# Patient Record
Sex: Male | Born: 1935 | Race: White | Hispanic: No | Marital: Married | Smoking: Never smoker
Health system: Southern US, Community
[De-identification: ages and names within clinical notes are randomized; demographics above are authoritative.]

## PROBLEM LIST (undated history)

## (undated) DIAGNOSIS — F0390 Unspecified dementia without behavioral disturbance: Secondary | ICD-10-CM

## (undated) DIAGNOSIS — I495 Sick sinus syndrome: Secondary | ICD-10-CM

## (undated) DIAGNOSIS — I2510 Atherosclerotic heart disease of native coronary artery without angina pectoris: Secondary | ICD-10-CM

## (undated) DIAGNOSIS — F319 Bipolar disorder, unspecified: Secondary | ICD-10-CM

## (undated) DIAGNOSIS — I714 Abdominal aortic aneurysm, without rupture, unspecified: Secondary | ICD-10-CM

## (undated) DIAGNOSIS — I82409 Acute embolism and thrombosis of unspecified deep veins of unspecified lower extremity: Secondary | ICD-10-CM

## (undated) DIAGNOSIS — R0902 Hypoxemia: Secondary | ICD-10-CM

## (undated) DIAGNOSIS — G479 Sleep disorder, unspecified: Secondary | ICD-10-CM

## (undated) DIAGNOSIS — I4891 Unspecified atrial fibrillation: Secondary | ICD-10-CM

## (undated) DIAGNOSIS — I10 Essential (primary) hypertension: Secondary | ICD-10-CM

## (undated) DIAGNOSIS — C61 Malignant neoplasm of prostate: Secondary | ICD-10-CM

## (undated) HISTORY — PX: CATARACT EXTRACTION W/ INTRAOCULAR LENS  IMPLANT, BILATERAL: SHX1307

## (undated) HISTORY — DX: Bipolar disorder, unspecified: F31.9

## (undated) HISTORY — DX: Hypoxemia: R09.02

## (undated) HISTORY — DX: Acute embolism and thrombosis of unspecified deep veins of unspecified lower extremity: I82.409

## (undated) HISTORY — DX: Atherosclerotic heart disease of native coronary artery without angina pectoris: I25.10

## (undated) HISTORY — PX: NASAL SINUS SURGERY: SHX719

## (undated) HISTORY — DX: Unspecified atrial fibrillation: I48.91

## (undated) HISTORY — DX: Malignant neoplasm of prostate: C61

## (undated) HISTORY — DX: Abdominal aortic aneurysm, without rupture: I71.4

## (undated) HISTORY — DX: Unspecified dementia, unspecified severity, without behavioral disturbance, psychotic disturbance, mood disturbance, and anxiety: F03.90

## (undated) HISTORY — DX: Sick sinus syndrome: I49.5

## (undated) HISTORY — DX: Sleep disorder, unspecified: G47.9

## (undated) HISTORY — DX: Essential (primary) hypertension: I10

## (undated) HISTORY — DX: Abdominal aortic aneurysm, without rupture, unspecified: I71.40

---

## 1999-03-14 ENCOUNTER — Other Ambulatory Visit: Admission: RE | Admit: 1999-03-14 | Discharge: 1999-03-14 | Payer: Self-pay | Admitting: Urology

## 1999-04-03 ENCOUNTER — Ambulatory Visit (HOSPITAL_COMMUNITY): Admission: RE | Admit: 1999-04-03 | Discharge: 1999-04-03 | Payer: Self-pay | Admitting: Urology

## 1999-04-03 ENCOUNTER — Encounter: Payer: Self-pay | Admitting: Urology

## 1999-04-07 ENCOUNTER — Encounter: Admission: RE | Admit: 1999-04-07 | Discharge: 1999-07-06 | Payer: Self-pay | Admitting: Radiation Oncology

## 1999-07-17 ENCOUNTER — Emergency Department (HOSPITAL_COMMUNITY): Admission: EM | Admit: 1999-07-17 | Discharge: 1999-07-17 | Payer: Self-pay | Admitting: Emergency Medicine

## 1999-07-20 ENCOUNTER — Emergency Department (HOSPITAL_COMMUNITY): Admission: EM | Admit: 1999-07-20 | Discharge: 1999-07-20 | Payer: Self-pay | Admitting: Emergency Medicine

## 1999-07-24 ENCOUNTER — Encounter (HOSPITAL_COMMUNITY): Admission: RE | Admit: 1999-07-24 | Discharge: 1999-10-22 | Payer: Self-pay | Admitting: Emergency Medicine

## 2003-04-17 ENCOUNTER — Ambulatory Visit (HOSPITAL_COMMUNITY): Admission: RE | Admit: 2003-04-17 | Discharge: 2003-04-17 | Payer: Self-pay | Admitting: Internal Medicine

## 2003-12-10 ENCOUNTER — Ambulatory Visit: Payer: Self-pay | Admitting: Licensed Clinical Social Worker

## 2003-12-17 ENCOUNTER — Ambulatory Visit: Payer: Self-pay | Admitting: Licensed Clinical Social Worker

## 2003-12-24 ENCOUNTER — Ambulatory Visit: Payer: Self-pay | Admitting: Licensed Clinical Social Worker

## 2004-01-08 ENCOUNTER — Ambulatory Visit: Payer: Self-pay | Admitting: Licensed Clinical Social Worker

## 2004-05-08 ENCOUNTER — Encounter: Admission: RE | Admit: 2004-05-08 | Discharge: 2004-06-11 | Payer: Self-pay | Admitting: Neurology

## 2005-03-02 ENCOUNTER — Emergency Department (HOSPITAL_COMMUNITY): Admission: EM | Admit: 2005-03-02 | Discharge: 2005-03-02 | Payer: Self-pay | Admitting: Emergency Medicine

## 2005-03-02 ENCOUNTER — Encounter: Admission: RE | Admit: 2005-03-02 | Discharge: 2005-03-30 | Payer: Self-pay | Admitting: Neurology

## 2005-03-31 ENCOUNTER — Encounter: Admission: RE | Admit: 2005-03-31 | Discharge: 2005-05-04 | Payer: Self-pay | Admitting: Neurology

## 2005-05-05 ENCOUNTER — Encounter: Admission: RE | Admit: 2005-05-05 | Discharge: 2005-08-03 | Payer: Self-pay | Admitting: Neurology

## 2005-06-30 ENCOUNTER — Inpatient Hospital Stay (HOSPITAL_COMMUNITY): Admission: EM | Admit: 2005-06-30 | Discharge: 2005-07-05 | Payer: Self-pay | Admitting: Emergency Medicine

## 2005-07-01 ENCOUNTER — Encounter: Payer: Self-pay | Admitting: Vascular Surgery

## 2005-07-01 ENCOUNTER — Encounter: Payer: Self-pay | Admitting: Cardiology

## 2006-06-12 ENCOUNTER — Encounter: Admission: RE | Admit: 2006-06-12 | Discharge: 2006-06-12 | Payer: Self-pay | Admitting: Gastroenterology

## 2006-06-16 ENCOUNTER — Ambulatory Visit: Payer: Self-pay | Admitting: Vascular Surgery

## 2006-10-18 ENCOUNTER — Encounter: Admission: RE | Admit: 2006-10-18 | Discharge: 2006-10-18 | Payer: Self-pay | Admitting: Gastroenterology

## 2006-10-26 ENCOUNTER — Encounter: Admission: RE | Admit: 2006-10-26 | Discharge: 2006-10-26 | Payer: Self-pay | Admitting: Gastroenterology

## 2006-11-05 ENCOUNTER — Encounter: Admission: RE | Admit: 2006-11-05 | Discharge: 2006-11-05 | Payer: Self-pay | Admitting: Surgery

## 2006-11-22 ENCOUNTER — Other Ambulatory Visit: Admission: RE | Admit: 2006-11-22 | Discharge: 2006-11-22 | Payer: Self-pay | Admitting: Otolaryngology

## 2007-01-07 ENCOUNTER — Encounter: Admission: RE | Admit: 2007-01-07 | Discharge: 2007-01-26 | Payer: Self-pay | Admitting: Neurology

## 2007-07-13 ENCOUNTER — Encounter: Admission: RE | Admit: 2007-07-13 | Discharge: 2007-07-13 | Payer: Self-pay | Admitting: Gastroenterology

## 2007-07-18 ENCOUNTER — Emergency Department (HOSPITAL_COMMUNITY): Admission: EM | Admit: 2007-07-18 | Discharge: 2007-07-19 | Payer: Self-pay | Admitting: Emergency Medicine

## 2007-07-18 ENCOUNTER — Ambulatory Visit (HOSPITAL_COMMUNITY): Admission: RE | Admit: 2007-07-18 | Discharge: 2007-07-18 | Payer: Self-pay | Admitting: Gastroenterology

## 2007-08-02 ENCOUNTER — Ambulatory Visit: Payer: Self-pay | Admitting: Vascular Surgery

## 2009-01-11 ENCOUNTER — Observation Stay (HOSPITAL_COMMUNITY): Admission: EM | Admit: 2009-01-11 | Discharge: 2009-01-12 | Payer: Self-pay | Admitting: Emergency Medicine

## 2009-01-22 ENCOUNTER — Encounter: Admission: RE | Admit: 2009-01-22 | Discharge: 2009-02-13 | Payer: Self-pay | Admitting: Internal Medicine

## 2009-02-13 ENCOUNTER — Encounter: Admission: RE | Admit: 2009-02-13 | Discharge: 2009-04-29 | Payer: Self-pay | Admitting: Internal Medicine

## 2009-02-28 ENCOUNTER — Ambulatory Visit: Payer: Self-pay | Admitting: Vascular Surgery

## 2009-09-02 ENCOUNTER — Ambulatory Visit: Payer: Self-pay | Admitting: Cardiology

## 2009-09-25 ENCOUNTER — Ambulatory Visit: Payer: Self-pay | Admitting: Cardiology

## 2010-01-02 ENCOUNTER — Ambulatory Visit: Payer: Self-pay | Admitting: Cardiology

## 2010-01-04 ENCOUNTER — Encounter: Payer: Self-pay | Admitting: Internal Medicine

## 2010-03-04 NOTE — Miscellaneous (Signed)
Summary: Device preload  Clinical Lists Changes  Observations: Added new observation of PPM INDICATN: A-fib (01/04/2010 12:32) Added new observation of MAGNET RTE: BOL 85 ERI 65 (01/04/2010 12:32) Added new observation of PPMLEADSTAT2: active (01/04/2010 12:32) Added new observation of PPMLEADSER2: [id number] (01/04/2010 12:32) Added new observation of PPMLEADMOD2: 5076  (01/04/2010 12:32) Added new observation of PPMLEADDOI2: 07/02/2005  (01/04/2010 12:32) Added new observation of PPMLEADLOC2: RV  (01/04/2010 12:32) Added new observation of PPMLEADSTAT1: active  (01/04/2010 12:32) Added new observation of PPMLEADSER1: [id number]  (01/04/2010 12:32) Added new observation of PPMLEADMOD1: 5076  (01/04/2010 12:32) Added new observation of PPMLEADDOI1: 07/02/2005  (01/04/2010 12:32) Added new observation of PPMLEADLOC1: RA  (01/04/2010 12:32) Added new observation of PPM DOI: 07/02/2005  (01/04/2010 12:32) Added new observation of PPM SERL#: [id number]  (01/04/2010 12:32) Added new observation of PPM MODL#: [id number]  (01/04/2010 12:32) Added new observation of PACEMAKERMFG: Medtronic  (01/04/2010 12:32) Added new observation of PPM IMP MD: Charlynn Court  (01/04/2010 12:32) Added new observation of PPM REFER MD: Rolla Plate  (01/04/2010 12:32) Added new observation of PACEMAKER MD: Hillis Range, MD  (01/04/2010 12:32)      PPM Specifications Following MD:  Hillis Range, MD     Referring MD:  Rolla Plate PPM Vendor:  Medtronic     PPM Model Number:  [id number]     PPM Serial Number:  [id number] PPM DOI:  07/02/2005     PPM Implanting MD:  Charlynn Court  Lead 1    Location: RA     DOI: 07/02/2005     Model #: [id number]     Serial #: [id number]     Status: active Lead 2    Location: RV     DOI: 07/02/2005     Model #: 4034     Serial #: [id number]     Status: active  Magnet Response Rate:  BOL 85 ERI 65  Indications:  A-fib

## 2010-03-18 ENCOUNTER — Encounter: Payer: Self-pay | Admitting: Internal Medicine

## 2010-03-18 ENCOUNTER — Telehealth: Payer: Self-pay | Admitting: Internal Medicine

## 2010-03-18 ENCOUNTER — Encounter (INDEPENDENT_AMBULATORY_CARE_PROVIDER_SITE_OTHER): Payer: Medicare Other

## 2010-03-18 DIAGNOSIS — I495 Sick sinus syndrome: Secondary | ICD-10-CM

## 2010-03-26 DIAGNOSIS — Z8546 Personal history of malignant neoplasm of prostate: Secondary | ICD-10-CM | POA: Insufficient documentation

## 2010-03-26 DIAGNOSIS — E7800 Pure hypercholesterolemia, unspecified: Secondary | ICD-10-CM | POA: Insufficient documentation

## 2010-03-26 DIAGNOSIS — I270 Primary pulmonary hypertension: Secondary | ICD-10-CM | POA: Insufficient documentation

## 2010-03-26 DIAGNOSIS — G20A1 Parkinson's disease without dyskinesia, without mention of fluctuations: Secondary | ICD-10-CM | POA: Insufficient documentation

## 2010-03-26 DIAGNOSIS — I4891 Unspecified atrial fibrillation: Secondary | ICD-10-CM | POA: Insufficient documentation

## 2010-03-26 DIAGNOSIS — G20 Parkinson's disease: Secondary | ICD-10-CM | POA: Insufficient documentation

## 2010-03-26 DIAGNOSIS — I359 Nonrheumatic aortic valve disorder, unspecified: Secondary | ICD-10-CM | POA: Insufficient documentation

## 2010-03-26 DIAGNOSIS — G4730 Sleep apnea, unspecified: Secondary | ICD-10-CM | POA: Insufficient documentation

## 2010-03-26 DIAGNOSIS — F319 Bipolar disorder, unspecified: Secondary | ICD-10-CM | POA: Insufficient documentation

## 2010-03-26 NOTE — Progress Notes (Signed)
Summary: trouble with device  Phone Note Call from Patient Call back at Home Phone [phone]   Caller: Spouse/ nancy Reason for Call: Talk to Nurse Summary of Call: pt wife states pt is having trouble with his device. pt states his heart is skipping, pt wife states she trouble transmitting as well.  Initial call taken by: Roe Coombs,  March 18, 2010 9:39 AM  Follow-up for Phone Call        device has reached ERI.  spoke w/pt's wife and pt to be scheduled to have ov.  will call back w/appt date and time. Vella Kohler  March 18, 2010 10:38 AM  spoke w/pt's wife--scheduled for 03-27-10 @ 1210.  Melissa to schedule Theadora Rama  March 18, 2010 4:56 PM     Appended Document: trouble with device reviewed.  I will see pt as scheduled.

## 2010-03-27 ENCOUNTER — Encounter: Payer: Self-pay | Admitting: Internal Medicine

## 2010-03-27 ENCOUNTER — Encounter (INDEPENDENT_AMBULATORY_CARE_PROVIDER_SITE_OTHER): Payer: Medicare Other | Admitting: Internal Medicine

## 2010-03-27 DIAGNOSIS — I495 Sick sinus syndrome: Secondary | ICD-10-CM

## 2010-03-27 DIAGNOSIS — R001 Bradycardia, unspecified: Secondary | ICD-10-CM | POA: Insufficient documentation

## 2010-03-27 DIAGNOSIS — I10 Essential (primary) hypertension: Secondary | ICD-10-CM | POA: Insufficient documentation

## 2010-04-01 NOTE — Letter (Signed)
Summary: Implantable Device Instructions  Architectural technologist, Main Office  [address]   [place], [place] [address]   Phone: [phone]  Fax: [phone]      Implantable Device Instructions  You are scheduled for:  ___x__ Generator Change  on April 08, 2010 with Dr. Johney Frame  1.  Please arrive at the Short Stay Center at [facility] at 10:30am on the day of your procedure.  2.  Do not eat or drink after midnight the night before your procedure.  3.  Complete lab work on 04/01/10.  The lab at [facility] is open from 8:30 AM to 1:30 PM and from 2:30 PM to 5:00 PM.  The lab at [facility] is open from 7:30 AM to 5:30 PM.  You do not have to be fasting.  4.  You may take all of your medications.  5.  Plan for an overnight stay.  Bring your insurance cards and a list of your medications.  6.  Wash your chest and neck with antibacterial soap (any brand) the evening before and the morning of your procedure.  Rinse well.   *If you have ANY questions after you get home, please call the office [phone].  Dennis Bast RN  *Every attempt is made to prevent procedures from being rescheduled.  Due to the nauture of Electrophysiology, rescheduling can happen.  The physician is always aware and directs the staff when this occurs.

## 2010-04-01 NOTE — Assessment & Plan Note (Signed)
Summary: pacer @ [facility] per kristen/mt   Visit Type:  Initial Consult Referring Provider:  Dr Patty Sermons Primary Provider:  Dr Clelia Croft  CC:  heart skipping.  History of Present Illness: Jacob Boyd is a pleasant 77 yo WM with a h/o atrial fibrillation with tachy/brady syndrome s/p PPM by Dr Reyes Ivan 07/02/05 (MDT) who presents today to establish care in the pacemaker clinic.  He reports doing very well s/p PPM implantation.  Over the past few weeks, he has had increasing irregularity of his heart beat.  He therefore presents today for evaluation.  Upon pacemaker interrogation, he is found to have reached ERI battery status. The patient has Parkinsons disease with mild congnative impairment.  His wife feels that he is doing reasonably well.  He remains somewhat active despite his age.  He denies CP, SOB, orthopnea, PND, dizziness, presyncope, or syncope.  He is otherwise without complaint today.  Current Medications (verified): 1)  Carbidopa-Levodopa 25-250 Mg Tabs (Carbidopa-Levodopa) .... Up To Four Times Daily 2)  Neurontin 300 Mg Caps (Gabapentin) .... Four Times Daily 3)  Lithium Carbonate 300 Mg Tabs (Lithium Carbonate) .... Once Daily 4)  Lopressor 50 Mg Tabs (Metoprolol Tartrate) .... 1/2 Tab Two Times A Day 5)  Vytorin 10-20 Mg Tabs (Ezetimibe-Simvastatin) .... Take One Tablet By Mouth Daily At Bedtime 6)  Trileptal 300 Mg Tabs (Oxcarbazepine) .... Four Daily 7)  Aspirin Ec 325 Mg Tbec (Aspirin) .... Take One Tablet At Bedtime 8)  Hydroxyzine Pamoate 25 Mg Caps (Hydroxyzine Pamoate) .Marland Kitchen.. 1-2 At Bedtime 9)  Exelon 9.5 Mg/24hr Pt24 (Rivastigmine) .... Use As Directed 10)  Vitamin D (Ergocalciferol) 50000 Unit Caps (Ergocalciferol) .Marland Kitchen.. 1 - Every Tuesday  Allergies (verified): 1)  ! Pcn  Past History:  Past Medical History: Tachy/brady syndrome s/p PPM (07/02/05) by Dr Reyes Ivan Atrial fibrillation AAA being managed conservatively per pt HTN Bipolar disorder Depression HTN HL Parkinsons  Disease  Mild dementia Prostate Ca s/p XRT  Past Surgical History: s/p PPM 2007 (MDT) by Dr Reyes Ivan  Family History: CAD  Social History:  He is married with 3 children.  He has never been a  smoker.  He is a retired Art therapist here in town.  His wife is a  Ph.D. psychologist here in town and is my patient.  They live in [place].  Rare ETOH.  Review of Systems       All systems are reviewed and negative except as listed in the HPI.   Vital Signs:  Patient profile:   77 year old male Height:      70 inches Weight:      183.50 pounds BMI:     26.42 Pulse rate:   104 / minute BP sitting:   129 / 89  (left arm) Cuff size:   regular  Vitals Entered By: Caralee Ates CMA (March 27, 2010 12:19 PM)  Physical Exam  General:  elderly male, NAD Head:  normocephalic and atraumatic Eyes:  PERRLA/EOM intact; conjunctiva and lids normal. Mouth:  Teeth, gums and palate normal. Oral mucosa normal. Neck:  supple Chest Wall:  L sided PPM site is well healed Lungs:  Clear bilaterally to auscultation and percussion. Heart:  RRR (paced), no m/r/g Abdomen:  Bowel sounds positive; abdomen soft and non-tender without masses, organomegaly, or hernias noted. No hepatosplenomegaly. Msk:  Back normal, normal gait. Muscle strength and tone normal. Extremities:  No clubbing or cyanosis. Neurologic:  Alert and oriented x 3. Skin:  Intact without lesions or rashes. Psych:  Normal affect.   PPM Specifications Following MD:  Hillis Range, MD     Referring MD:  Rolla Plate PPM Vendor:  Medtronic     PPM Model Number:  [id number]     PPM Serial Number:  [id number] PPM DOI:  07/02/2005     PPM Implanting MD:  Charlynn Court  Lead 1    Location: RA     DOI: 07/02/2005     Model #: 5076     Serial #: [id number]     Status: active Lead 2    Location: RV     DOI: 07/02/2005     Model #: 8295     Serial #: [id number]     Status: active  Magnet Response Rate:  BOL 85 ERI  65  Indications:  A-fib  MD Comments:  see scanned report  Impression & Recommendations:  Problem # 1:  BRADYCARDIA-TACHYCARDIA SYNDROME (ICD-427.81) The patient presents today to establish care in the pacemaker clinic.  He has reached ERI battery status and converted to VVI pacing.  WE have reprogrammed his device today to promote AV synchrony while in sinus, though he presents today in an atypical flutter.   Risks, benefits, alternatives to pacemaker pulse generator replacement were discussed in detail with the patient today.  He understands that the risks include but are not limited to bleeding, infection, MI, stroke, and death.  He accepts these risks and wishes to proceed.  We will therefore plan generator change at the next available time.  Problem # 2:  ATRIAL FIBRILLATION (ICD-427.31) stable He should ideally be on coumadin for stroke prevention.  I will defer this decision to Dr Patty Sermons who knows the patient well. no changes today  Problem # 3:  ESSENTIAL HYPERTENSION, BENIGN (ICD-401.1) stable no changes

## 2010-04-08 ENCOUNTER — Ambulatory Visit (HOSPITAL_COMMUNITY): Payer: Medicare Other

## 2010-04-08 ENCOUNTER — Ambulatory Visit (HOSPITAL_COMMUNITY)
Admission: RE | Admit: 2010-04-08 | Discharge: 2010-04-08 | Disposition: A | Discharge status: Home / Self Care | Payer: Medicare Other | Source: Ambulatory Visit | Attending: Internal Medicine | Admitting: Internal Medicine

## 2010-04-08 DIAGNOSIS — Z45018 Encounter for adjustment and management of other part of cardiac pacemaker: Secondary | ICD-10-CM | POA: Insufficient documentation

## 2010-04-08 DIAGNOSIS — I495 Sick sinus syndrome: Secondary | ICD-10-CM

## 2010-04-08 DIAGNOSIS — Z01818 Encounter for other preprocedural examination: Secondary | ICD-10-CM | POA: Insufficient documentation

## 2010-04-08 DIAGNOSIS — Z01812 Encounter for preprocedural laboratory examination: Secondary | ICD-10-CM | POA: Insufficient documentation

## 2010-04-08 DIAGNOSIS — I4892 Unspecified atrial flutter: Secondary | ICD-10-CM | POA: Insufficient documentation

## 2010-04-08 DIAGNOSIS — I4891 Unspecified atrial fibrillation: Secondary | ICD-10-CM

## 2010-04-08 DIAGNOSIS — Z01810 Encounter for preprocedural cardiovascular examination: Secondary | ICD-10-CM | POA: Insufficient documentation

## 2010-04-08 HISTORY — PX: PACEMAKER INSERTION: SHX728

## 2010-04-08 LAB — BASIC METABOLIC PANEL
BUN: 30 mg/dL — ABNORMAL HIGH (ref 6–23)
BUN: 29 mg/dL — ABNORMAL HIGH (ref 6–23)
Calcium: 9 mg/dL (ref 8.4–10.5)
Chloride: 108 mEq/L (ref 96–112)
Creatinine, Ser: 1.56 mg/dL — ABNORMAL HIGH (ref 0.4–1.5)
GFR calc Af Amer: 52 mL/min — ABNORMAL LOW (ref >60)
GFR calc Af Amer: 54 mL/min — ABNORMAL LOW (ref >60)
GFR calc non Af Amer: 45 mL/min — ABNORMAL LOW (ref >60)
Glucose, Bld: 101 mg/dL — ABNORMAL HIGH (ref 70–99)
Potassium: 5.2 mEq/L — ABNORMAL HIGH (ref 3.5–5.1)
Sodium: 139 mEq/L (ref 135–145)

## 2010-04-08 LAB — CBC
Hemoglobin: 14 g/dL (ref 13.0–17.0)
MCH: 29 pg (ref 26.0–34.0)
Platelets: 173 10*3/uL (ref 150–400)
RDW: 12.9 % (ref 11.5–15.5)
WBC: 8.1 10*3/uL (ref 4.0–10.5)

## 2010-04-08 LAB — SURGICAL PCR SCREEN: MRSA, PCR: NEGATIVE

## 2010-04-08 LAB — PROTIME-INR
INR: 1.1 (ref 0.00–1.49)
Prothrombin Time: 14.4 seconds (ref 11.6–15.2)

## 2010-04-10 ENCOUNTER — Encounter: Payer: Medicare Other | Admitting: Internal Medicine

## 2010-04-10 NOTE — Cardiovascular Report (Signed)
Summary: Office Visit   Office Visit   Imported By: Roderic Ovens 04/04/2010 12:32:49  _____________________________________________________________________  External Attachment:    Type:   Image     Comment:   External Document

## 2010-04-10 NOTE — Cardiovascular Report (Signed)
Summary: Office Visit   Office Visit   Imported By: Roderic Ovens 04/04/2010 12:33:24  _____________________________________________________________________  External Attachment:    Type:   Image     Comment:   External Document

## 2010-04-15 ENCOUNTER — Telehealth: Payer: Self-pay | Admitting: Internal Medicine

## 2010-04-16 ENCOUNTER — Encounter: Payer: Self-pay | Admitting: Internal Medicine

## 2010-04-18 NOTE — Op Note (Signed)
NAME:  Jacob Boyd, Jacob Boyd NO.:  [id number]  MEDICAL RECORD NO.:  [id number]           PATIENT TYPE:  O  LOCATION:  [facility]                         FACILITY:  [facility]  PHYSICIAN:  Hillis Range, MD       DATE OF BIRTH:  1928-01-27  DATE OF PROCEDURE: DATE OF DISCHARGE:  04/08/2010                              OPERATIVE REPORT   SURGEON:  Hillis Range, MD  PREPROCEDURE DIAGNOSES: 1. Tachycardia-bradycardia syndrome. 2. Atypical atrial flutter and atrial fibrillation. 3. Pacemaker at elective replacement indicator.  POSTPROCEDURE DIAGNOSES: 1. Tachycardia-bradycardia syndrome. 2. Atypical atrial flutter and atrial fibrillation. 3. Pacemaker at elective replacement indicator.  PROCEDURE:  Pacemaker pulse generator replacement with skin pocket revision.  INTRODUCTION:  [name] is a pleasant 77 year old gentleman with a history of atrial fibrillation, atypical atrial flutter, and tachycardia/bradycardia syndrome, status post pacemaker implantation by Dr. Reyes Ivan on May 02, 2005.  Unfortunately, he has reached elective replacement indicator.  He therefore presents today for pacemaker pulse generator replacement.  DESCRIPTION OF PROCEDURE:  Informed written consent was obtained and the patient was brought to the Electrophysiology Lab in the fasting state. His pacemaker was interrogated and confirmed to be at [facility] have battery status.  No sedation was required for the procedure today.  The patient was prepped and draped in the usual sterile fashion by the EP Lab staff. The skin overlying his existing pacemaker was infiltrated with lidocaine for local analgesia.  A 4-cm incision was made over the pacemaker pocket.  The pacemaker was exposed using a combination of sharp and blunt dissection.  A single #2 silk suture was identified and removed, which was securing the device to the pectoralis fascia.  The pacemaker was then removed from the body and disconnected from the  leads.  The atrial lead was confirmed to be a Medtronic model [id number] - 52 (serial number [id number]) lead implanted on Jul 02, 2005.  The right ventricular lead was confirmed to be a Medtronic model 5076 - 58 (serial number [id number]) lead implanted on Jul 02, 2005.  Both leads were examined and their integrity was found to be intact.  Atrial flutter waves measured 3.6 millivolts with an impedance of 486 ohms.  Right ventricular lead R-waves measured 7.1 millivolts with an impedance of 401 ohms and a threshold of 0.5 volts at 0.5 milliseconds.  Both leads were connected to a Medtronic Adapta L model ADDR01 (serial number [id number]) pacemaker.  The pacemaker pocket was then revised to accommodate the new device.  The pocket was then irrigated with copious gentamicin solution.  The pacemaker was then placed into the pocket. The pocket was then closed in two layers with 2-0 Vicryl suture for the subcutaneous and subcuticular layers.  Steri-Strips and a sterile dressing were then applied.  There were no early apparent complications.  CONCLUSIONS: 1. Successful pacemaker pulse generator replacement with a Medtronic     Adapta L dual-chamber pacemaker. 2. No early apparent complications.     Hillis Range, MD     JA/MEDQ  D:  04/08/2010  T:  04/09/2010  Job:  [id number]  cc:   Cassell Clement,  M.D. Kari Baars, M.D.  Electronically Signed by Hillis Range MD on 04/18/2010 10:56:32 PM

## 2010-04-21 ENCOUNTER — Ambulatory Visit (INDEPENDENT_AMBULATORY_CARE_PROVIDER_SITE_OTHER): Payer: Medicare Other

## 2010-04-21 ENCOUNTER — Encounter: Payer: Self-pay | Admitting: Internal Medicine

## 2010-04-21 ENCOUNTER — Encounter (INDEPENDENT_AMBULATORY_CARE_PROVIDER_SITE_OTHER): Payer: Medicare Other

## 2010-04-21 DIAGNOSIS — I4891 Unspecified atrial fibrillation: Secondary | ICD-10-CM

## 2010-04-21 DIAGNOSIS — Z7901 Long term (current) use of anticoagulants: Secondary | ICD-10-CM

## 2010-04-21 DIAGNOSIS — I495 Sick sinus syndrome: Secondary | ICD-10-CM

## 2010-04-22 NOTE — Miscellaneous (Signed)
Summary: Device change out  Clinical Lists Changes  Observations: Added new observation of PPM IMP MD: Hillis Range, MD (04/16/2010 9:45) Added new observation of PPM DOI: 04/08/2010 (04/16/2010 9:45) Added new observation of PPM SERL#: [id number] (04/16/2010 9:45) Added new observation of PPM MODL#: ADDRL1 (04/16/2010 9:45) Added new observation of PPMEXPLCOMM: 04/08/10 Medtronic EnRhythm [id number] H explanted (04/16/2010 9:45)      PPM Specifications Following MD:  Hillis Range, MD     Referring MD:  Rolla Plate PPM Vendor:  Medtronic     PPM Model Number:  ADDRL1     PPM Serial Number:  [id number] PPM DOI:  04/08/2010     PPM Implanting MD:  Hillis Range, MD  Lead 1    Location: RA     DOI: 07/02/2005     Model #: 7829     Serial #: [id number]     Status: active Lead 2    Location: RV     DOI: 07/02/2005     Model #: 8469     Serial #: [id number]     Status: active  Magnet Response Rate:  BOL 85 ERI 65  Indications:  A-fib  Explantation Comments:  04/08/10 Medtronic EnRhythm P1501DR/[id number] H explanted

## 2010-04-22 NOTE — Progress Notes (Signed)
Summary: want to [facility] when he can shower  Phone Note Call from Patient Call back at Home Phone [phone]   Caller: Chesley Noon Summary of Call: Pt wife calling regarding her husband pacermaker want to know when the pt can take a shower Initial call taken by: Judie Grieve,  April 15, 2010 12:26 PM  Follow-up for Phone Call        okay to shower one week after the procedure Dennis Bast, RN, BSN  April 15, 2010 12:31 PM

## 2010-04-28 ENCOUNTER — Encounter (INDEPENDENT_AMBULATORY_CARE_PROVIDER_SITE_OTHER): Payer: Medicare Other | Admitting: *Deleted

## 2010-04-28 ENCOUNTER — Ambulatory Visit (INDEPENDENT_AMBULATORY_CARE_PROVIDER_SITE_OTHER): Payer: Medicare Other | Admitting: *Deleted

## 2010-04-28 DIAGNOSIS — R0989 Other specified symptoms and signs involving the circulatory and respiratory systems: Secondary | ICD-10-CM

## 2010-04-28 DIAGNOSIS — I4891 Unspecified atrial fibrillation: Secondary | ICD-10-CM

## 2010-04-28 DIAGNOSIS — Z7901 Long term (current) use of anticoagulants: Secondary | ICD-10-CM

## 2010-05-06 LAB — CBC
HCT: 38.5 % — ABNORMAL LOW (ref 39.0–52.0)
Hemoglobin: 13 g/dL (ref 13.0–17.0)
MCHC: 33.8 g/dL (ref 30.0–36.0)
MCV: 87.9 fL (ref 78.0–100.0)
RDW: 14.9 % (ref 11.5–15.5)

## 2010-05-06 LAB — CK ISOENZYMES
CK-MB: 0 % (ref <5)
CK-MM: 100 % (ref 95–100)
Creatine Kinase-Total: 104 U/L (ref 44–196)

## 2010-05-06 LAB — POCT I-STAT, CHEM 8
Creatinine, Ser: 1.4 mg/dL (ref 0.4–1.5)
HCT: 39 % (ref 39.0–52.0)
Hemoglobin: 13.3 g/dL (ref 13.0–17.0)
Potassium: 4.4 mEq/L (ref 3.5–5.1)

## 2010-05-06 LAB — DIFFERENTIAL: Lymphocytes Relative: 6 % — ABNORMAL LOW (ref 12–46)

## 2010-05-06 LAB — BASIC METABOLIC PANEL
BUN: 29 mg/dL — ABNORMAL HIGH (ref 6–23)
Calcium: 8.7 mg/dL (ref 8.4–10.5)
Creatinine, Ser: 1.29 mg/dL (ref 0.4–1.5)
GFR calc Af Amer: >60 mL/min (ref >60)
Potassium: 4.2 mEq/L (ref 3.5–5.1)
Sodium: 137 mEq/L (ref 135–145)

## 2010-05-06 NOTE — Procedures (Signed)
Summary: wound check/mt   Current Medications (verified): 1)  Carbidopa-Levodopa 25-250 Mg Tabs (Carbidopa-Levodopa) .... Up To Four Times Daily 2)  Neurontin 300 Mg Caps (Gabapentin) .... Four Times Daily 3)  Lithium Carbonate 300 Mg Tabs (Lithium Carbonate) .... Once Daily 4)  Lopressor 50 Mg Tabs (Metoprolol Tartrate) .... 1/2 Tab Two Times A Day 5)  Vytorin 10-20 Mg Tabs (Ezetimibe-Simvastatin) .... Take One Tablet By Mouth Daily At Bedtime 6)  Trileptal 300 Mg Tabs (Oxcarbazepine) .... Four Daily 7)  Aspirin Ec 325 Mg Tbec (Aspirin) .... Take One Tablet At Bedtime 8)  Hydroxyzine Pamoate 25 Mg Caps (Hydroxyzine Pamoate) .Marland Kitchen.. 1-2 At Bedtime 9)  Exelon 9.5 Mg/24hr Pt24 (Rivastigmine) .... Use As Directed 10)  Vitamin D (Ergocalciferol) 50000 Unit Caps (Ergocalciferol) .Marland Kitchen.. 1 - Every Tuesday  Allergies (verified): 1)  ! Pcn  PPM Specifications Following MD:  Hillis Range, MD     Referring MD:  Rolla Plate PPM Vendor:  Medtronic     PPM Model Number:  ADDRL1     PPM Serial Number:  [id number] [facility] PPM DOI:  04/08/2010     PPM Implanting MD:  Hillis Range, MD  Lead 1    Location: RA     DOI: 07/02/2005     Model #: 8469     Serial #: [id number]     Status: active Lead 2    Location: RV     DOI: 07/02/2005     Model #: 4401     Serial #: [id number]     Status: active  Magnet Response Rate:  BOL 85 ERI 65  Indications:  A-fib  Explantation Comments:  04/08/10 Medtronic EnRhythm [id number] H explanted Tech Comments:  meds reviewed. see paceart report. Vella Kohler  April 21, 2010 3:35 PM   Patient Instructions: 1)  Your physician recommends that you schedule a follow-up appointment in: 07-10-10 @ 1045 with Dr Johney Frame.

## 2010-05-14 ENCOUNTER — Ambulatory Visit (INDEPENDENT_AMBULATORY_CARE_PROVIDER_SITE_OTHER): Payer: Medicare Other | Admitting: *Deleted

## 2010-05-14 DIAGNOSIS — I4891 Unspecified atrial fibrillation: Secondary | ICD-10-CM

## 2010-05-14 LAB — POCT INR: INR: 2.5

## 2010-05-16 ENCOUNTER — Other Ambulatory Visit: Payer: Self-pay | Admitting: *Deleted

## 2010-05-16 DIAGNOSIS — Z79899 Other long term (current) drug therapy: Secondary | ICD-10-CM

## 2010-05-16 DIAGNOSIS — E7800 Pure hypercholesterolemia, unspecified: Secondary | ICD-10-CM

## 2010-05-19 ENCOUNTER — Ambulatory Visit (INDEPENDENT_AMBULATORY_CARE_PROVIDER_SITE_OTHER): Payer: Medicare Other | Admitting: Cardiology

## 2010-05-19 ENCOUNTER — Encounter: Payer: Self-pay | Admitting: *Deleted

## 2010-05-19 ENCOUNTER — Encounter: Payer: Self-pay | Admitting: Cardiology

## 2010-05-19 ENCOUNTER — Other Ambulatory Visit (INDEPENDENT_AMBULATORY_CARE_PROVIDER_SITE_OTHER): Payer: Medicare Other | Admitting: *Deleted

## 2010-05-19 ENCOUNTER — Ambulatory Visit (INDEPENDENT_AMBULATORY_CARE_PROVIDER_SITE_OTHER): Payer: Medicare Other | Admitting: *Deleted

## 2010-05-19 DIAGNOSIS — I4891 Unspecified atrial fibrillation: Secondary | ICD-10-CM

## 2010-05-19 DIAGNOSIS — I495 Sick sinus syndrome: Secondary | ICD-10-CM

## 2010-05-19 DIAGNOSIS — G20 Parkinson's disease: Secondary | ICD-10-CM

## 2010-05-19 DIAGNOSIS — E7800 Pure hypercholesterolemia, unspecified: Secondary | ICD-10-CM

## 2010-05-19 DIAGNOSIS — Z79899 Other long term (current) drug therapy: Secondary | ICD-10-CM

## 2010-05-19 LAB — HEPATIC FUNCTION PANEL
ALT: 21 U/L (ref 0–53)
Bilirubin, Direct: 0.1 mg/dL (ref 0.0–0.3)
Total Bilirubin: 0.5 mg/dL (ref 0.3–1.2)
Total Protein: 6.3 g/dL (ref 6.0–8.3)

## 2010-05-19 LAB — BASIC METABOLIC PANEL
BUN: 32 mg/dL — ABNORMAL HIGH (ref 6–23)
Calcium: 9.4 mg/dL (ref 8.4–10.5)
Chloride: 106 mEq/L (ref 96–112)
Creatinine, Ser: 1.5 mg/dL (ref 0.4–1.5)

## 2010-05-19 LAB — LIPID PANEL
Cholesterol: 184 mg/dL (ref 0–200)
HDL: 60.7 mg/dL (ref >39.00)
LDL Cholesterol: 103 mg/dL — ABNORMAL HIGH (ref 0–99)
Triglycerides: 102 mg/dL (ref 0.0–149.0)

## 2010-05-19 NOTE — Assessment & Plan Note (Signed)
The patient had his pacemaker pulse generator replacement with skin pocket revision by Dr. Johney Frame on 04/08/10.  He is not having any problem from the operative site.  There is no evidence of any hemorrhage or hematoma.  The patient has not been having any dizzy spells or syncope.

## 2010-05-19 NOTE — Assessment & Plan Note (Signed)
The patient's Parkinson's disease has been stable.  The patient is not having any new Parkinson's symptoms.  He walks in his neighborhood for exercise.

## 2010-05-19 NOTE — Progress Notes (Signed)
Taeveon Keesling Ballengee Date of Birth:  July 10, 1927 [facility] [address]   [address] [place], [place]  [address] [phone]           Fax   [phone]  History of Present Illness: This pleasant 77 year old retired gentleman is seen for a scheduled followup visit.  He has a history of tachybradycardia syndrome.  His initial pacemaker was placed on 07/02/05 by Dr. Reyes Ivan.  He had a pacemaker pulse generator replacement on 04/08/10 by Dr. Johney Frame.  The patient is not having any chest pain or shortness of breath he is having the dizzy spells or syncope.  He has a past history of PVCs which have subsided.  He does not have any history of known coronary disease and had a normal adenosine Cardiolite stress test in January 2003.  He had a echocardiogram on 08/10/07 which showed moderate LVH with normal systolic function with impaired relaxation and also demonstrated moderate aortic sclerosis without aortic stenosis and without aortic insufficiency.  He had mild mitral regurgitation and normal pulmonary artery pressure.  Current Outpatient Prescriptions  Medication Sig Dispense Refill  . aspirin 325 MG tablet Take 81 mg by mouth daily.       . carbidopa-levodopa (SINEMET) 25-250 MG per tablet Take 1 tablet by mouth 4 (four) times daily.        . Cholecalciferol (VITAMIN D PO) Take by mouth.       . ezetimibe-simvastatin (VYTORIN) 10-20 MG per tablet Take 1 tablet by mouth at bedtime.        . gabapentin (NEURONTIN) 300 MG capsule Take 300 mg by mouth 4 (four) times daily.        Marland Kitchen lithium 300 MG capsule Take 300 mg by mouth 1 dose over 46 hours.        . metoprolol tartrate (LOPRESSOR) 25 MG tablet Take 25 mg by mouth 2 (two) times daily.        . multivitamin (THERAGRAN) per tablet Take 1 tablet by mouth daily.        . Oxcarbazepine (TRILEPTAL) 300 MG tablet Take 300 mg by mouth 4 (four) times daily.       . rivastigmine (EXELON) 4.6 mg/24hr Place 1 patch onto the skin daily.         Marland Kitchen warfarin (COUMADIN) 5 MG tablet Take 5 mg by mouth daily. Taking 5mg x3 days and 2.5mg x 4 days      . donepezil (ARICEPT) 10 MG tablet Take 10 mg by mouth at bedtime as needed.        . vitamin B-12 (CYANOCOBALAMIN) 100 MCG tablet Take 50 mcg by mouth daily.          Allergies  Allergen Reactions  . Penicillins     Patient Active Problem List  Diagnoses  . HYPERCHOLESTEROLEMIA  . BIPOLAR AFFECTIVE DISORDER  . PARKINSON'S DISEASE  . ESSENTIAL HYPERTENSION, BENIGN  . PRIMARY PULMONARY HYPERTENSION  . AORTIC VALVE SCLEROSIS  . Atrial fibrillation  . BRADYCARDIA-TACHYCARDIA SYNDROME  . SLEEP APNEA  . PERSONAL HISTORY MALIGNANT NEOPLASM PROSTATE    History  Smoking status  . Never Smoker   Smokeless tobacco  . Not on file    History  Alcohol Use: Not on file    No family history on file.  Review of Systems: Constitutional: no fever chills diaphoresis or fatigue or change in weight.  Head and neck: no hearing loss, no epistaxis, no photophobia or visual disturbance. Respiratory: No cough, shortness of breath  or wheezing. Cardiovascular: No chest pain peripheral edema, palpitations. Gastrointestinal: No abdominal distention, no abdominal pain, no change in bowel habits hematochezia or melena. Genitourinary: No dysuria, no frequency, no urgency, no nocturia. Musculoskeletal:No arthralgias, no back pain, no gait disturbance or myalgias. Neurological: No dizziness, no headaches, no numbness, no seizures, no syncope, no weakness, no tremors. Hematologic: No lymphadenopathy, no easy bruising. Psychiatric: No confusion, no hallucinations, no sleep disturbance.    Physical Exam: Filed Vitals:   05/19/10 1019  BP: 120/72  Pulse: 80  His weight is 182, unchanged.  The general appearance reveals a pleasant elderly gentleman in no distress.Pupils equal and reactive.   Extraocular Movements are full.  There is no scleral icterus.  The mouth and pharynx are normal.  The  neck is supple.  The carotids reveal no bruits.  The jugular venous pressure is normal.  The thyroid is not enlarged.  There is no lymphadenopathy.The chest is clear to percussion and auscultation. There are no rales or rhonchi. Expansion of the chest is symmetrical.The precordium is quiet.  The first heart sound is normal.  The second heart sound is physiologically split.  There is no murmur gallop rub or click.  There is no abnormal lift or heave.The abdomen is soft and nontender. Bowel sounds are normal. The liver and spleen are not enlarged. There Are no abdominal masses. There are no bruits.The pedal pulses are good.  There is no phlebitis or edema.  There is no cyanosis or clubbing. Neurologic exam reveals mild parkinsonism tremor.The skin is warm and dry.  There is no rash.  Assessment / Plan: Continue same medication.  Recheck in one month for protime, 2 months for a protime, and 3 months for a office visit protime and EKG

## 2010-05-19 NOTE — Assessment & Plan Note (Signed)
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  He has a history of tachybradycardia syndrome.  He recently had his pacemaker generator replaced.  He was noted that time to have atrial fibrillation and is now on Coumadin.  He's not having any problems from the Coumadin.  His INR today is therapeutic at 2.4 on the dose of 5 mg Monday Wednesday and Friday and 2.5 mg other days.  The patient has not been having any falling spells.  He is trying to get regular exercise by going to the silver sneakers program Out at [employer].

## 2010-06-03 ENCOUNTER — Encounter: Payer: Medicare Other | Admitting: *Deleted

## 2010-06-16 ENCOUNTER — Ambulatory Visit (INDEPENDENT_AMBULATORY_CARE_PROVIDER_SITE_OTHER): Payer: Medicare Other | Admitting: *Deleted

## 2010-06-16 ENCOUNTER — Encounter: Payer: Medicare Other | Admitting: *Deleted

## 2010-06-16 DIAGNOSIS — I4891 Unspecified atrial fibrillation: Secondary | ICD-10-CM

## 2010-06-17 NOTE — Assessment & Plan Note (Signed)
OFFICE VISIT   Jacob Boyd, Jacob Boyd  DOB:  1927-11-09                                       08/02/2007  EAVWU#:[id number]   I saw the patient in the office today for continued followup of his  abdominal aortic aneurysm.  When I saw him last a year ago the aneurysm  measured 3.3 cm in maximum diameter.  I recommended followup ultrasound  in 1 year.  Since I saw him last he has had no significant abdominal or  back pain.  There has been no significant change in his medical history.   PAST MEDICAL HISTORY:  Significant for hypertension and  hypercholesterolemia.   SOCIAL HISTORY:  He is not a smoker.   REVIEW OF SYSTEMS AND MEDICATIONS:  He has had no recent chest pain,  chest pressure, palpitations or arrhythmias.  He has had no bronchitis,  asthma or wheezing.  Review of systems is otherwise documented on the  medical history form in his chart as are his medications.   PHYSICAL EXAMINATION:  General:  On physical examination this is a  pleasant 77 year old gentleman who appears his stated age.  Neck:  Neck  is supple.  There is no cervical lymphadenopathy.  I do not detect any  carotid bruits.  Vital signs:  Blood pressure is 133/74, heart rate is  60.  Lungs:  Are clear bilaterally to auscultation.  Cardiac:  He has a  regular rate and rhythm.  Abdomen:  His aneurysm is easily palpable and  nontender.  Extremities:  He has palpable femoral and posterior tibial  pulses bilaterally.  He has no significant lower extremity swelling.  Neurologic:  Exam is nonfocal.  He has no evidence of atheroembolic  disease.   Ultrasound of his aneurysm shows the maximum diameter is 3.1 cm.  Thus  there has been no change in the size of his aneurysm.   Given that the aneurysm is fairly small and given his age and the fact  that the aneurysm has not changed in size I think it is reasonable to  stretch his followup out to 18 months.  I will see him back at that time  with a  followup ultrasound.  He knows to call sooner if he has problems.   Di Kindle. Edilia Bo, M.D.  Electronically Signed   CSD/MEDQ  D:  08/02/2007  T:  08/03/2007  Job:  [id number]

## 2010-06-17 NOTE — Procedures (Signed)
DUPLEX ULTRASOUND OF ABDOMINAL AORTA   INDICATION:  Followup of known AAA.   HISTORY:  Diabetes:  No.  Cardiac:  Pacemaker two years ago.  Hypertension:  Yes.  Smoking:  No.  Connective Tissue Disorder:  Family History:  No.  Previous Surgery:  Cardiac.   DUPLEX EXAM:         AP (cm)                   TRANSVERSE (cm)  Proximal             1.95 Cm                   2.08 cm  Mid                  2.65 cm                   3.06 cm  Distal               1.85 cm                   2.01 cm  Right Iliac          0.83 cm                   0.85 cm  Left Iliac           1.13 cm                   1.17 cm   PREVIOUS:  Date: 06/16/06  AP:  2.7  TRANSVERSE:  3.3   IMPRESSION:  1. Stable abdominal aortic aneurysm with maximum diameter measuring      2.65 cm (anteroposterior) X 3.06 cm.  2. Calcific plaquing noted in abdominal aortic aneurysm.  3. Ectatic left common iliac artery.   ___________________________________________  Di Kindle. Edilia Bo, M.D.   PB/MEDQ  D:  08/02/2007  T:  08/02/2007  Job:  [id number]

## 2010-06-17 NOTE — Procedures (Signed)
DUPLEX ULTRASOUND OF ABDOMINAL AORTA   INDICATION:  Followup of abdominal aortic aneurysm.   HISTORY:  Diabetes:  No.  Cardiac:  Pacemaker.  Hypertension:  Yes.  Smoking:  No.  Connective Tissue Disorder:  Family History:  No.  Previous Surgery:  No.   DUPLEX EXAM:         AP (cm)                   TRANSVERSE (cm)  Proximal             2.56 cm                   2.36 cm  Mid                  3.11 cm                   2.88 cm  Distal               1.9 cm                    2.2 cm  Right Iliac          1.12 cm                   1.51 cm  Left Iliac           0.66 cm                   1.12 cm   PREVIOUS:  Date:  08/02/2007  AP:  2.65  TRANSVERSE:  3.06   IMPRESSION:  1. Abdominal aortic aneurysm with largest measurement 3.11 x 2.88 cm.  2. Thickening of aortic walls in mid aorta.   ___________________________________________  Di Kindle. Edilia Bo, M.D.   CJ/MEDQ  D:  02/28/2009  T:  02/28/2009  Job:  [id number]

## 2010-06-17 NOTE — Assessment & Plan Note (Signed)
OFFICE VISIT   Jacob Boyd, Jacob Boyd  DOB:  [birth year]                                       02/28/2009  AVWUJ#:[id number]   I saw the patient in the office today for continued followup of a small  abdominal aortic aneurysm.  I last saw him in June of 2009 when it  measured 3.1 cm in maximum diameter.  There had been no change in the  size of the aneurysm so I recommended that we stretch his followup out  to a year and a half.  Since I saw him last he has had no significant  abdominal or back pain.  He has hypertension and hypercholesterolemia,  both of which are stable on his current medications.  He also has a  pacemaker.  He denies any history of diabetes, history of previous  myocardial infarction or history of congestive heart failure.   SOCIAL HISTORY:  He is married with three children.  He does not smoke  cigarettes.   REVIEW OF SYSTEMS:  CARDIOVASCULAR:  He has had no recent chest pain,  chest pressure, palpitations or significant orthopnea.  He does have  some dyspnea on exertion.  He has had no real claudication or rest pain.  He has had no history of stroke or TIAs.  He has had no history of DVT.  PULMONARY:  He has had no productive cough, bronchitis, asthma or  wheezing.   PHYSICAL EXAMINATION:  General:  This is a pleasant 77 year old  gentleman who appears his stated age.  Vital signs:  Blood pressure is  130/80, heart rate is 61.  Temperature is 97.4.  Lungs:  Clear  bilaterally to auscultation without rales, rhonchi or wheezing.  Cardiovascular:  I do not detect any carotid bruits.  He has a regular  rate and rhythm.  He has palpable radial pulses and palpable femoral and  posterior tibial pulses bilaterally.  He has a left dorsalis pedis with  no right dorsalis pedis pulse.  Abdomen:  Soft and nontender.  His  aneurysm is easily palpable and nontender.  He has normal pitched bowel  sounds.  Neurologic:  He has no focal weakness or  paresthesias.   I have independently interpreted his ultrasound which shows that the  maximum diameter of his aneurysm is 3.1 cm.  Thus there has been no  significant change compared to the study a year and a half ago.  For  this reason I think it is safe to continue his followup at 18 months and  will see him back in 18 months with a followup duplex scan and I have  ordered this test.  He knows to call sooner if he has problems.     Di Kindle. Edilia Bo, M.D.  Electronically Signed   CSD/MEDQ  D:  02/28/2009  T:  03/01/2009  Job:  [id number]   cc:   Kari Baars, M.D.  Cassell Clement, M.D.

## 2010-06-20 NOTE — Cardiovascular Report (Signed)
NAME:  Jacob Boyd, Jacob Boyd NO.:  [id number]   MEDICAL RECORD NO.:  [id number]          PATIENT TYPE:  INP   LOCATION:  2006                         FACILITY:  [facility]   PHYSICIAN:  Elmore Guise., M.D.DATE OF BIRTH:  [birth year]   DATE OF PROCEDURE:  07/02/2005  DATE OF DISCHARGE:                              CARDIAC CATHETERIZATION   PROCEDURE:  Dual-chamber permanent pacemaker implant.   INDICATIONS FOR PROCEDURE:  Tachybrady syndrome with significant sinus  pauses up to 6 seconds.   DESCRIPTION OF PROCEDURE:  The patient brought to the cardiac cath lab after  appropriate informed consent.  He was presently sterile fashion.  Approximately 20 mL of 1% lidocaine was used for local anesthesia.  A 1-1/2  inch incision was made in the left deltopectoral groove.  A subcutaneous  pocket was then made with blunt and Bovie dissection.  A venogram was then  performed showing the root of the left axillary/subclavian vein.  A 7-French  sheath was placed in the left subclavian vein under fluoroscopic guidance  x2.  The ventricular lead was then placed.  It is a Medtronic 5076-58 cm,  serial number [id number], to the RV septum.  The following measurements were  obtained:  Threshold of 0.7 V with a pulse width of 0.5 msec, impedance of  787 ohms.  R waves measured 6.5 mV with a current of 1.2 mA.  Ten-volt check  was negative.  The atrial lead was then placed.  The atrial lead is a  Medtronic 5076-52 cm, serial number [id number], active-fixation lead, which  was placed in the right atrial appendage.  The following measurements were  obtained:  No threshold was obtained.  The patient was in atrial flutter  with flutter the rate of 300 per minute.  Pulse of 0.5 msec, impedance of  785 Ohms with P waves measuring 4.2 mV.  Ten-volt check was in negative.  After placing the active fixation leads, the pocket was copiously irrigated  with kanamycin solution.  All bleeding had stopped  at that time.  The atrial  and ventricular lead were then identified and placed in the appropriate  headers and an InRhythm P1501DR Medtronic generator, serial number  [id number], generator was sewn into the pocket.  Pocket was then closed in 3  continuous layers with 2-0, followed by 2-0, followed by 4-0 Vicryl suture.  Steri-Strips were applied.  The patient tolerated the procedure well.  The  patient did have recurrent atrial flutter on placing the ventricular lead.  He initially was paced out, however, did go back into atrial flutter during  the procedure.  He was given 3 doses of intravenous Lopressor for rate  control with success.  The patient tolerated the procedure well.  He will be  transported to the holding area in stable condition.      Elmore Guise., M.D.  Electronically Signed     TWK/MEDQ  D:  07/02/2005  T:  07/02/2005  Job:  [id number]   cc:   Cassell Clement, M.D.  Fax: [phone]

## 2010-06-20 NOTE — Consult Note (Signed)
NAME:  Jacob Boyd, Jacob Boyd NO.:  [id number]   MEDICAL RECORD NO.:  [id number]          PATIENT TYPE:  INP   LOCATION:  2006                         FACILITY:  [facility]   PHYSICIAN:  Di Kindle. Edilia Bo, M.D.DATE OF BIRTH:  12-14-1927   DATE OF CONSULTATION:  07/01/2005  DATE OF DISCHARGE:                                   CONSULTATION   REASON FOR CONSULTATION:  Transient left leg pain.   HISTORY:  This is a pleasant 77 year old gentleman who last night had  developed fairly sudden onset of left ankle pain.  He states that this  lasted approximately 30 minutes and resolved spontaneously.  He states that  he has had no further left leg pain or paresthesias.  Prior to this, he  denied any history of claudication, although his activity I believe is  somewhat limited because of his Parkinson's disease.  He denies any history  of previous rest pain or any history of nonhealing ulcers.  The patient is  ambulatory with a cane.  He had been seen in the [facility] where it  was noted that the foot was slightly cooler on the left however, when he was  seen at [facility] this had actually resolved.   PAST MEDICAL HISTORY:  1.  Parkinson's disease.  2.  Bipolar disorder.  3.  Sleep apnea.  4.  History of prostate cancer.  5.  Hypercholesterolemia.  6.  He denies any history of diabetes, hypertension, history of previous      myocardial infarction, history of congestive heart failure, or history      of COPD.   PAST SURGICAL HISTORY:  Significant for surgery on his uvula for sleep  apnea.   SOCIAL HISTORY:  He is married and has three children.  He has never used  tobacco.   FAMILY HISTORY:  He is unaware of any history of premature cardiovascular  disease.   REVIEW OF SYSTEMS:  He has had no recent chest pain, chest pressure,  palpitations or arrhythmias that he is aware of. PULMONARY:  He has had no  bronchitis, asthma, or wheezing.  GI:  He has had no recent  change in his  bowel habits.  GU:  He has had no dysuria or frequency.  He does have a  history of prostate cancer in the past.  VASCULAR:  He has had no history  stroke, TIAs, expressive or receptive aphasia or amaurosis fugax.  He has  had no history of DVT or phlebitis that he is aware of. Review of systems is  otherwise unremarkable and is documented in his admission history and  physical.   PHYSICAL EXAMINATION:  VITAL SIGNS:  Temperature is 97.8, blood pressure  107/78, heart rate is 92.  HEENT:  There is no carotid bruits.  There is no cervical lymphadenopathy.  CARDIAC:  He is bradycardic currently and has had some pauses and is  currently being monitored.  LUNGS:  Clear bilaterally to auscultation.  ABDOMEN:  Soft and nontender.  I cannot palpate an aneurysm.  EXTREMITIES:  There is no significant lower extremity swelling.  He has some  mild eczema in both legs.  VASCULAR:  Exam reveals normal femoral and popliteal pulses bilaterally and  a palpable right posterior tibial pulse.  On the right side, he has a  monophasic peroneal and anterior tibial signal I cannot get a dorsalis pedis  signal on the right.  On the left side, he has a monophasic peroneal,  posterior tibial and dorsalis pedis signal.  EXTREMITIES:  Both feet appear adequately perfused.  The color is good in  both feet.  He has no motor or sensory deficit currently in his lower  extremities.   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 Differential diagnosis  if he did have an embolic event would be a cardiac source of embolus which  is being worked up by cardiology. Will also check an abdominal ultrasound to  rule out an aneurysm.  In addition, he has slightly prominent popliteal  pulses and will duplex his popliteal arteries to rule out popliteal  artery  aneurysms.  He is also to have ABIs today.  As he is asymptomatic and given  his age, I would not plan to be overly aggressive as he currently does have  an adequately perfused foot.  If his symptoms worsen or workup reveals  evidence he is at risk for continued embolization then consideration would  be given for arteriography and revascularization.  Will make further  recommendations pending the results of his workup.      Di Kindle. Edilia Bo, M.D.  Electronically Signed     CSD/MEDQ  D:  07/01/2005  T:  07/01/2005  Job:  [id number]   cc:   Cassell Clement, M.D.  Fax: [phone]

## 2010-06-20 NOTE — Discharge Summary (Signed)
NAME:  Jacob Boyd, Jacob Boyd NO.:  [id number]   MEDICAL RECORD NO.:  [id number]          PATIENT TYPE:  INP   LOCATION:  2006                         FACILITY:  [facility]   PHYSICIAN:  Cassell Clement, M.D. DATE OF BIRTH:  1927-11-09   DATE OF ADMISSION:  06/30/2005  DATE OF DISCHARGE:  07/05/2005                                 DISCHARGE SUMMARY   FINAL DIAGNOSES:  1.  Paroxysmal atrial fibrillation with tachybrady syndrome and 6-second      pauses.  2.  Transient left leg pain felt secondary to systemic embolus secondary to      atrial fibrillation.  3.  Parkinson's disease.  4.  Bipolar disorder.  5.  Sleep apnea.  6.  History of prostate cancer.  7.  Hypercholesterolemia.  8.  Aortic valve sclerosis.  9.  Mild pulmonary artery hypertension.  10. Dilated left atrium.   OPERATIONS PERFORMED:  1.  Duplex scanning of legs.  2.  Echocardiogram.  3.  Implantation of a dual-chamber permanent pacemaker using EnRhythm      Medtronic generator by Dr. Lady Deutscher.   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 Lady Deutscher.  He presented with left lower extremity  pain.  He had gone to an urgent care center and was found to have a cold  left lower extremity with diminished pulses and was sent to the emergency  room for further evaluation.  By the time he was seen in the emergency room  by Dr. Reyes Ivan he felt better but he was admitted for further observation.  He was in normal sinus rhythm at the time of his initial presentation.  Initial evaluation revealed that he had stable vital signs with a blood  pressure of 163/87, pulse of 54 and regular, oxygen saturation 98%.  He had  a grade 2/6 systolic ejection murmur at the base.  The abdomen was soft and  nontender.  He did have diminished arterial pulses on the left but Doppler  did detect them.   HOSPITAL COURSE:  The patient was begun on IV heparin and aspirin.   Vascular  surgery consult was obtained with Dr. Cari Caraway who saw the patient  early in the morning of Jul 01, 2005, and felt that the patient might have  tibial occlusive disease on the left which could either be chronic or that  he could have had an embolic event to the left lower extremity.  During the  night after admission the patient did go into spontaneous atrial  fibrillation, raising the likelihood that this was a systemic embolus.  With  the atrial fibrillation he was placed on IV Cardizem overnight and then had  prolonged pauses up to 6 seconds requiring IV Cardizem to be stopped.  He  remained in atrial fibrillation until 7:19 a.m. on the morning after  admission when he converted spontaneously back to normal sinus rhythm.  It  was felt that the patient had tachybrady syndrome and would need a permanent  pacemaker in order to effectively try to keep him  out of atrial  fibrillation.  It was felt that the patient would need to continue on IV  heparin and then be transitioned to Coumadin after placement of pacemaker.  The patient did not have any chest pain or evidence of myocardial ischemia.  He was seen by Dr. Lady Deutscher who agreed with the indications for  pacemaker implant for tachybrady syndrome and the patient underwent  implantation on May 31 with a Medtronic leads and Medtronic generator.  His  vascular workup was unremarkable.  The duplex showed no evidence of any  popliteal artery aneurysms and the ultrasound of the abdominal aorta showed  an infrarenal aorta maximum diameter of 3.3.  Dr. Edilia Bo recommended a 1-  year followup ultrasound which he will arrange.  75 noted that the left foot  was adequately perfused and he did not recommend angiography unless the  patient developed new signs or symptoms of left foot ischemia.  His foot  showed continued improvement during the hospital course.  Post pacemaker  implantation the patient went into atrial flutter during  the procedure and  remained in the flutter throughout the remainder of the implantation, but  then converted later that day back to sinus rhythm and he had no further  atrial fibrillation after that.  Coumadin was begun on the evening of the  pacemaker and by July 05, 2005, his INR had become therapeutic at 2.7 and IV  heparin was stopped and the patient was able to be discharged home.  On the  day of discharge there is slight fullness of the pacer site and so we will  aim for the low end of the therapeutic Coumadin range, and we are also going  to reduce his Ecotrin back to just 81 mg a day.  It was felt that the  patient would need to be on Coumadin for a reasonable length of time and his  rhythm can be interrogated through the pacemaker, and if he has no further  episodes of atrial fibrillation he may be able to get off of Coumadin after  several months.  Because of his Parkinson's he is prone to falling, and  therefore we are not anxious to keep him on Coumadin indefinitely unless the  indications are strong.  However, since the patient has had one systemic  embolus to his foot, we want to be sure that he does not go in and out of  atrial fibrillation which would necessitate Coumadin indefinitely.   The patient is ready for discharge on July 05, 2005.  Blood work at the time  of discharge includes hemoglobin 14, white count 8300.  Sodium 137,  potassium 4.0, BUN 19, creatinine 1.1.  INR is 2.7.  Cholesterol 208, LDL  136, HDL 61 triglycerides 54.  Lithium level 1.1.  T4 5.5, TSH 1.06, D-dimer  on admission was normal 0.29.  The patient is not presently on a statin for  cholesterol.  He will be placed on a low-cholesterol diet and followup lipid  panel will be obtained in the office.  The patient will be rechecked in the  office in 2 weeks for office visit, EKG, protime, CBC and iron studies.  Iron studies are being done because of the patient's history of restless leg syndrome for which  he presently takes Neurontin.  The home health RN will  see the patient and draw protimes on every Monday and Thursday and phone  results to [place], and he will also receive home health physical therapy to help  with his Parkinson's and his activity.  He was given instructions to keep  the pacer wound dry until a week after the implantation, then he may start  to shower.  He will be on a low-cholesterol diet.   MEDICATIONS:  1.  Lopressor 50 mg tablets taking one-half tablet twice a day.  2.  MiraLax 17 g daily.  3.  Coated aspirin 81 mg daily.  4.  Warfarin 5 mg taking a-half tablet daily or as directed.  5.  Carbidopa/levodopa 25/250 taking one-and-a-half at 0800, one at 1200,      one-and-a-half at 1500, and one at 1800.  6.  Mysoline 50 mg taking two at 0800, 1200, and 1500.  7.  Effexor XR 300 in the morning, 75 mg at night.  8.  Lithium 450 mg tablets taking one-and-a-half tablets at bedtime.  9.  Lamictal 100 mg taking two at bedtime.  10. Comtan 200 mg one at 0800, one at 1200, one at 1500, and one at 1800.  11. Neurontin 300 mg at h.s.  12. Tylenol p.r.n. for pain.   CONDITION AT DISCHARGE:  Improved.           ______________________________  Cassell Clement, M.D.     TB/MEDQ  D:  07/05/2005  T:  07/06/2005  Job:  [id number]   cc:   Elmore Guise., M.D.  Fax: [phone]   Di Kindle. Edilia Bo, M.D.  [address]  [place]  [place] [address]

## 2010-06-20 NOTE — H&P (Signed)
NAME:  Jacob Boyd, Jacob Boyd NO.:  [id number]   MEDICAL RECORD NO.:  [id number]          PATIENT TYPE:  INP   LOCATION:  1852                         FACILITY:  [facility]   PHYSICIAN:  Elmore Guise., M.D.DATE OF BIRTH:  1927-09-07   DATE OF ADMISSION:  06/30/2005  DATE OF DISCHARGE:                                HISTORY & PHYSICAL   INDICATION FOR ADMISSION:  Diminished pulses and left lower extremity leg  pain.   HISTORY OF PRESENT ILLNESS:  The patient is a very pleasant 77 year old  white male with past medical history of Parkinson's disease, bipolar  disorder, sleep apnea and history of prostate cancer who presented for  evaluation of left lower extremity pain.  The patient stated pain started  this afternoon about approximately 3 p.m. The pain was initially behind his  left knee extending down to his left foot.  Continued for the next 2-3  hours, went to Urgent Care, was found to have no significant pulses in his  left lower extremity and was then sent here to the emergency room for  further evaluation.  He reports after he left the Urgent Care office, his  pain started to improve. Now his pain in his lower leg is almost totally  resolved.  He states he has never had any problems with this before.  He  denies any claudication symptoms.  However, he has not been very active here  lately.  He does report he has having lack of hair on both lower legs for  some time.  He typically is ambulatory with a cane because I lose my  balance.  He denies any significant chest pain or shortness of breath.  He  does have neuropathic pain in both lower extremities primarily in his upper  feet with pins and needles  From cardiac standpoint, he denies any  palpitations, presyncope or syncope.  No orthopnea, PND, no recent fever,  chills, nausea, vomiting or diarrhea.  Has been tolerating a normal diet  without any difficulty.  Denies any tobacco use.   CURRENT MEDICATIONS:   Include Effexor, Neurontin, Levadopa, Lithium,  Lamictal and aspirin.   ALLERGIES:  PENICILLIN causing hives.   FAMILY HISTORY:  Positive for coronary disease.   SOCIAL HISTORY:  He is married.  He is retired Art therapist.  No  tobacco.  He does drink an occasional glass of wine.   EXAM:  He is afebrile.  Temperature is 98.4, pulse is 54, blood pressure  163/87, respirations are 20, saturating 98% on room air.  In general, he is  very pleasant, elderly white male alert and oriented x4 in no acute  distress.  Does have a resting tremor noted in his upper and lower  extremities.  He has no JVD.  He had no bruits.  He has 2+ carotid pulses  with normal upstroke.  Thyroid appears normal.  Lungs were clear.  Heart is  regular with a soft 2/6 systolic ejection murmur.  Abdomen is soft,  nontender, nondistended.  Positive bowel sounds.  No rebound or guarding.  No abdominal bruits noted.  He has trace pretibial edema.  2+ femoral pulses  bilaterally with no bruit.  He has 2+ popliteal pulses bilaterally, 2+ pedal  pulses on the right and dopplerable pedal pulse on the left.  His left lower  leg is warm with diminished capillary feel from his knee distally.  He does  have psoriasis of note on his knee area as well as his lower legs on both  lower extremities.  His current lab work is pending at time of dictation.   IMPRESSION:  1.  Likely peripheral vascular disease with occlusion initially of his      distal popliteal and smaller pedal arteries now clinically with      significant improvement.  2.  History of bipolar disorder.  3.  History of Parkinson's disease.   PLAN:  We will admit for observation.  Start IV heparin and aspirin.  Likely  cardioembolic source.  Will check echo, routine labs with CBC, CMP, serial  CPKs.  The patient will have a duplex scan in the morning with ABI.  Vascular surgery was notified of the patient's arrival.  We will plan to see  him in the morning,  however, should his pain return and his pulses become  absent, urgent vascular surgery consult for tonight.      Elmore Guise., M.D.  Electronically Signed     TWK/MEDQ  D:  06/30/2005  T:  06/30/2005  Job:  [id number]

## 2010-07-10 ENCOUNTER — Ambulatory Visit (INDEPENDENT_AMBULATORY_CARE_PROVIDER_SITE_OTHER): Payer: Medicare Other | Admitting: Internal Medicine

## 2010-07-10 ENCOUNTER — Encounter: Payer: Self-pay | Admitting: Internal Medicine

## 2010-07-10 DIAGNOSIS — I10 Essential (primary) hypertension: Secondary | ICD-10-CM

## 2010-07-10 DIAGNOSIS — I495 Sick sinus syndrome: Secondary | ICD-10-CM

## 2010-07-10 DIAGNOSIS — I4891 Unspecified atrial fibrillation: Secondary | ICD-10-CM

## 2010-07-10 NOTE — Progress Notes (Signed)
The patient presents today for routine electrophysiology followup.  Since having his pacemaker generator change, the patient reports doing very well.  He denies fevers, chills, or difficulty with healing.  He has been initiated on coumadin and is tolerating this rather well.  Today, he denies symptoms of palpitations, chest pain, shortness of breath, orthopnea, PND, lower extremity edema, dizziness, presyncope, syncope, or neurologic sequela.  The patient feels that he is tolerating medications without difficulties and is otherwise without complaint today.   Past Medical History  Diagnosis Date  . Prostate cancer     with radiation  . Bipolar 1 disorder   . Coronary artery disease   . Atrial fibrillation     persistent, with atypical atrial flutter  . Tachycardia-bradycardia     s/p PPM by Dr Reyes Ivan, Gen change by Fawn Kirk 3/12  . Parkinson's disease   . Hypertension   . AAA (abdominal aortic aneurysm)     medical management  . Dementia     mild   Past Surgical History  Procedure Date  . Nasal sinus surgery   . Pacemaker insertion 04/08/2010    Initial pacemaker by Dr Reyes Ivan, gen change by Fawn Kirk (MDT) 3/12    Current Outpatient Prescriptions  Medication Sig Dispense Refill  . aspirin 81 MG tablet Take 81 mg by mouth daily.        . carbidopa-levodopa (SINEMET) 25-250 MG per tablet Take 1 tablet by mouth 4 (four) times daily.        . Cholecalciferol (VITAMIN D PO) Take by mouth.       . ezetimibe-simvastatin (VYTORIN) 10-20 MG per tablet Take 1 tablet by mouth at bedtime.        . gabapentin (NEURONTIN) 300 MG capsule Take 300 mg by mouth 4 (four) times daily.        Marland Kitchen lithium 300 MG capsule Take 300 mg by mouth 1 dose over 46 hours.        . metoprolol tartrate (LOPRESSOR) 25 MG tablet Take 25 mg by mouth 2 (two) times daily.        . multivitamin (THERAGRAN) per tablet Take 1 tablet by mouth daily.        . Oxcarbazepine (TRILEPTAL) 300 MG tablet Take 300 mg by mouth 4 (four) times daily.        . rivastigmine (EXELON) 4.6 mg/24hr Place 1 patch onto the skin daily.        Marland Kitchen warfarin (COUMADIN) 5 MG tablet Take 5 mg by mouth daily. Taking 5mg x3 days and 2.5mg x 4 days      . DISCONTD: aspirin 325 MG tablet Take 81 mg by mouth daily.       Marland Kitchen DISCONTD: donepezil (ARICEPT) 10 MG tablet Take 10 mg by mouth at bedtime as needed.        Marland Kitchen DISCONTD: vitamin B-12 (CYANOCOBALAMIN) 100 MCG tablet Take 50 mcg by mouth daily.          Allergies  Allergen Reactions  . Penicillins     History   Social History  . Marital Status: Married    Spouse Name: N/A    Number of Children: N/A  . Years of Education: N/A   Occupational History  . Not on file.   Social History Main Topics  . Smoking status: Never Smoker   . Smokeless tobacco: Not on file  . Alcohol Use: No  . Drug Use: No  . Sexually Active: Not on file   Other Topics Concern  .  Not on file   Social History Narrative  . No narrative on file    Physical Exam: Filed Vitals:   07/10/10 1058  BP: 108/72  Pulse: 60  Height: 5\' 10"  (1.778 m)  Weight: 179 lb (81.194 kg)    GEN- The patient is elderly appearing, alert and oriented x 3 today.   Head- normocephalic, atraumatic Eyes-  Sclera clear, conjunctiva pink Ears- hearing intact Oropharynx- clear Neck- supple, no JVP Lymph- no cervical lymphadenopathy Lungs- Clear to ausculation bilaterally, normal work of breathing Chest- pacemaker pocket is well healed Heart- Regular rate and rhythm, no murmurs, rubs or gallops, PMI not laterally displaced GI- soft, NT, ND, + BS Extremities- no clubbing, cyanosis, or edema MS- no significant deformity or atrophy Skin- no rash or lesion Psych- euthymic mood, full affect Neuro- strength and sensation are intact  Pacemaker interrogation- reviewed in detail today,  See PACEART report  Assessment and Plan:

## 2010-07-10 NOTE — Patient Instructions (Signed)
Your physician wants you to follow-up in: 04/2010 with Dr Allred You will receive a reminder letter in the mail two months in advance. If you don't receive a letter, please call our office to schedule the follow-up appointment.  

## 2010-07-10 NOTE — Assessment & Plan Note (Signed)
Continue coumadin as per Dr Patty Sermons

## 2010-07-10 NOTE — Assessment & Plan Note (Signed)
Normal pacemaker function See Pace Art report No changes today  

## 2010-07-10 NOTE — Assessment & Plan Note (Signed)
Stable No change required today  

## 2010-07-18 ENCOUNTER — Ambulatory Visit (INDEPENDENT_AMBULATORY_CARE_PROVIDER_SITE_OTHER): Payer: Medicare Other | Admitting: *Deleted

## 2010-07-18 DIAGNOSIS — I4891 Unspecified atrial fibrillation: Secondary | ICD-10-CM

## 2010-08-13 ENCOUNTER — Other Ambulatory Visit (INDEPENDENT_AMBULATORY_CARE_PROVIDER_SITE_OTHER): Payer: Medicare Other

## 2010-08-13 DIAGNOSIS — I739 Peripheral vascular disease, unspecified: Secondary | ICD-10-CM

## 2010-08-15 ENCOUNTER — Encounter: Payer: Medicare Other | Admitting: *Deleted

## 2010-08-18 ENCOUNTER — Ambulatory Visit (INDEPENDENT_AMBULATORY_CARE_PROVIDER_SITE_OTHER): Payer: Medicare Other | Admitting: *Deleted

## 2010-08-18 DIAGNOSIS — I4891 Unspecified atrial fibrillation: Secondary | ICD-10-CM

## 2010-08-18 LAB — POCT INR: INR: 1.6

## 2010-08-26 NOTE — Procedures (Unsigned)
DUPLEX ULTRASOUND OF ABDOMINAL AORTA  INDICATION:  Follow up abdominal aortic aneurysm.  HISTORY: Diabetes:  No. Cardiac:  Pacemaker. Hypertension:  No. Smoking:  No. Connective Tissue Disorder: Family History:  No. Previous Surgery:  No.  DUPLEX EXAM:         AP (cm)                   TRANSVERSE (cm) Proximal             3.02 cm                   2.96 cm Mid                  3.15 cm                   3.16 cm Distal               2.65 cm                   2.62 cm Right Iliac          1.19 cm                   1.22 cm Left Iliac           1.24 cm                   1.14 cm  PREVIOUS:  Date: 02/28/2009  AP:  3.11  TRANSVERSE:  2.88  IMPRESSION: 1. Mild aneurysmal dilatation noted at the proximal and mid aorta     segments. 2. The proximal aorta's largest diameter measurement is 3.02 cm X 2.96     cm without intramural thrombus present. 3. The mid aorta's largest diameter measurement is 3.15 cm X 3.16 cm     without intramural thrombus present. 4. This is essentially unchanged since the previous study on     02/28/2009.  ___________________________________________ Di Kindle. Edilia Bo, M.D.  SH/MEDQ  D:  08/13/2010  T:  08/13/2010  Job:  [id number]

## 2010-09-01 ENCOUNTER — Ambulatory Visit (INDEPENDENT_AMBULATORY_CARE_PROVIDER_SITE_OTHER): Payer: Medicare Other | Admitting: *Deleted

## 2010-09-01 DIAGNOSIS — I4891 Unspecified atrial fibrillation: Secondary | ICD-10-CM

## 2010-09-22 ENCOUNTER — Ambulatory Visit (INDEPENDENT_AMBULATORY_CARE_PROVIDER_SITE_OTHER): Payer: Medicare Other | Admitting: *Deleted

## 2010-09-22 DIAGNOSIS — I4891 Unspecified atrial fibrillation: Secondary | ICD-10-CM

## 2010-10-08 ENCOUNTER — Ambulatory Visit (INDEPENDENT_AMBULATORY_CARE_PROVIDER_SITE_OTHER): Payer: Medicare Other | Admitting: *Deleted

## 2010-10-08 DIAGNOSIS — I4891 Unspecified atrial fibrillation: Secondary | ICD-10-CM

## 2010-10-08 LAB — POCT INR: INR: 3.7

## 2010-10-29 ENCOUNTER — Ambulatory Visit (INDEPENDENT_AMBULATORY_CARE_PROVIDER_SITE_OTHER): Payer: Medicare Other | Admitting: *Deleted

## 2010-10-29 ENCOUNTER — Telehealth: Payer: Self-pay | Admitting: Cardiology

## 2010-10-29 DIAGNOSIS — I4891 Unspecified atrial fibrillation: Secondary | ICD-10-CM

## 2010-10-29 LAB — POCT INR: INR: 3.4

## 2010-10-29 NOTE — Telephone Encounter (Signed)
Pt wife calling.  Pt forgot his coumadin dose last night and took it this morning instead.  He has an appt to check his INR this afternoon and wanted to make sure this would not affect his reading.

## 2010-10-29 NOTE — Telephone Encounter (Signed)
Pt's wife is calling about his coumadin level

## 2010-10-30 LAB — POCT I-STAT, CHEM 8
Chloride: 105
HCT: 36 — ABNORMAL LOW
Hemoglobin: 12.2 — ABNORMAL LOW
Potassium: 4.7
Sodium: 137

## 2010-10-30 LAB — PROTIME-INR
INR: 1.1
Prothrombin Time: 14.4

## 2010-10-30 LAB — D-DIMER, QUANTITATIVE: D-Dimer, Quant: 0.36

## 2010-11-19 ENCOUNTER — Ambulatory Visit (INDEPENDENT_AMBULATORY_CARE_PROVIDER_SITE_OTHER): Payer: Medicare Other | Admitting: *Deleted

## 2010-11-19 DIAGNOSIS — I4891 Unspecified atrial fibrillation: Secondary | ICD-10-CM

## 2010-11-19 LAB — POCT INR: INR: 2.4

## 2010-12-17 ENCOUNTER — Ambulatory Visit (INDEPENDENT_AMBULATORY_CARE_PROVIDER_SITE_OTHER): Payer: Medicare Other | Admitting: *Deleted

## 2010-12-17 DIAGNOSIS — I4891 Unspecified atrial fibrillation: Secondary | ICD-10-CM

## 2010-12-29 ENCOUNTER — Other Ambulatory Visit: Payer: Self-pay | Admitting: *Deleted

## 2010-12-29 MED ORDER — WARFARIN SODIUM 5 MG PO TABS: 5.0000 mg | ORAL_TABLET | ORAL | Status: DC

## 2011-01-14 ENCOUNTER — Encounter: Payer: Medicare Other | Admitting: *Deleted

## 2011-01-14 ENCOUNTER — Ambulatory Visit (INDEPENDENT_AMBULATORY_CARE_PROVIDER_SITE_OTHER): Payer: Medicare Other | Admitting: *Deleted

## 2011-01-14 DIAGNOSIS — I4891 Unspecified atrial fibrillation: Secondary | ICD-10-CM

## 2011-01-14 LAB — POCT INR: INR: 3.2

## 2011-02-05 ENCOUNTER — Encounter: Payer: Medicare Other | Admitting: *Deleted

## 2011-02-09 ENCOUNTER — Encounter: Payer: Self-pay | Admitting: Cardiology

## 2011-02-09 ENCOUNTER — Ambulatory Visit (INDEPENDENT_AMBULATORY_CARE_PROVIDER_SITE_OTHER): Payer: Medicare Other | Admitting: Cardiology

## 2011-02-09 ENCOUNTER — Ambulatory Visit (INDEPENDENT_AMBULATORY_CARE_PROVIDER_SITE_OTHER): Payer: Medicare Other | Admitting: *Deleted

## 2011-02-09 VITALS — BP 130/86 | HR 80 | Ht 70.0 in | Wt 186.0 lb

## 2011-02-09 DIAGNOSIS — E7800 Pure hypercholesterolemia, unspecified: Secondary | ICD-10-CM

## 2011-02-09 DIAGNOSIS — I4891 Unspecified atrial fibrillation: Secondary | ICD-10-CM

## 2011-02-09 DIAGNOSIS — I10 Essential (primary) hypertension: Secondary | ICD-10-CM

## 2011-02-09 LAB — POCT INR: INR: 1.9

## 2011-02-09 NOTE — Assessment & Plan Note (Signed)
Patient has a history of hypercholesterolemia and is on Vytorin.  Lipids are monitored by his primary care physician Dr. Eric Form.

## 2011-02-09 NOTE — Progress Notes (Signed)
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  He has a history of tachybradycardia syndrome and underlying atrial fibrillation.  He had a new pulse generator replacement on 04/08/10 by Dr. Johney Frame.  He is due to see Dr. Johney Frame again in March of 2013.  Patient does not have any history of ischemic heart disease and had a normal adenosine Cardiolite stress test in 2003.  He has a history of diastolic dysfunction by echocardiogram in 2009.  Since last visit she's been doing well with no chest pain shortness of breath or palpitations.  Current Outpatient Prescriptions  Medication Sig Dispense Refill  . aspirin 81 MG tablet Take 81 mg by mouth daily.        . carbidopa-levodopa (SINEMET) 25-250 MG per tablet Take 1 tablet by mouth 4 (four) times daily.        . Cholecalciferol (VITAMIN D PO) Take by mouth.       . ezetimibe-simvastatin (VYTORIN) 10-20 MG per tablet Take 1 tablet by mouth at bedtime.        . gabapentin (NEURONTIN) 300 MG capsule Take 300 mg by mouth 4 (four) times daily.        Marland Kitchen lithium 300 MG capsule Take 300 mg by mouth 1 dose over 46 hours.        . metoprolol tartrate (LOPRESSOR) 25 MG tablet Take 25 mg by mouth 2 (two) times daily.        . multivitamin (THERAGRAN) per tablet Take 1 tablet by mouth daily.        . Oxcarbazepine (TRILEPTAL) 300 MG tablet Take 300 mg by mouth 4 (four) times daily.       . rivastigmine (EXELON) 4.6 mg/24hr Place 1 patch onto the skin daily.        Marland Kitchen warfarin (COUMADIN) 5 MG tablet Take 1 tablet (5 mg total) by mouth as directed.  40 tablet  3    Allergies  Allergen Reactions  . Penicillins     Patient Active Problem List  Diagnoses  . HYPERCHOLESTEROLEMIA  . BIPOLAR AFFECTIVE DISORDER  . PARKINSON'S DISEASE  . ESSENTIAL HYPERTENSION,  BENIGN  . PRIMARY PULMONARY HYPERTENSION  . AORTIC VALVE SCLEROSIS  . Atrial fibrillation  . BRADYCARDIA-TACHYCARDIA SYNDROME  . SLEEP APNEA  . PERSONAL HISTORY MALIGNANT NEOPLASM PROSTATE    History  Smoking status  . Never Smoker   Smokeless tobacco  . Not on file    History  Alcohol Use No    No family history on file.  Review of Systems: The patient denies any heat or cold intolerance.  No weight gain or weight loss.  The patient denies headaches or blurry vision.  There is no cough or sputum production.  The patient denies dizziness.  There is no hematuria or hematochezia.  The patient denies any muscle aches or arthritis.  The patient denies any rash.  The patient denies frequent falling or instability.  There is no history of depression or anxiety.  All other systems were reviewed and are negative.   Physical Exam: Filed Vitals:   02/09/11 1509  BP: 130/86  Pulse: 80   the general appearance reveals a well-developed elderly gentleman in no distress.Pupils equal and reactive.   Extraocular Movements are full.  There is no scleral icterus.  The mouth and pharynx  are normal.  The neck is supple.  The carotids reveal no bruits.  The jugular venous pressure is normal.  The thyroid is not enlarged.  There is no lymphadenopathy.  The chest is clear to percussion and auscultation. There are no rales or rhonchi. Expansion of the chest is symmetrical.  The precordium is quiet.  The first heart sound is normal.  The second heart sound is physiologically split.  There is no murmur gallop rub or click.  There is no abnormal lift or heave.  The abdomen is soft and nontender. Bowel sounds are normal. The liver and spleen are not enlarged. There Are no abdominal masses. There are no bruits.  The pedal pulses are good.  There is no phlebitis or edema.  There is no cyanosis or clubbing. Strength is normal and symmetrical in all extremities.  There is no lateralizing weakness.  There are  no sensory deficits.  He does have difficulty initiating movements consistent with his Parkinson disease      Assessment / Plan: Continue same medication.  Recheck in 6 months for followup office visit and EKG.  Continue Coumadin through Coumadin clinic

## 2011-02-09 NOTE — Assessment & Plan Note (Signed)
The patient is on long-term Coumadin followed in the Coumadin clinic.  He has had no TIA symptoms.  He does have poor mobility secondary to his Parkinson's.

## 2011-02-09 NOTE — Assessment & Plan Note (Signed)
Blood pressures remained stable on current therapy.  Denies any dizzy spells or syncope.  No chest pain or angina.  He exercises at a physical therapy facility at [facility] 3 days a week.

## 2011-02-09 NOTE — Patient Instructions (Signed)
Your physician recommends that you continue on your current medications as directed. Please refer to the Current Medication list given to you today.  Your physician wants you to follow-up in: 6 months. You will receive a reminder letter in the mail two months in advance. If you don't receive a letter, please call our office to schedule the follow-up appointment.  

## 2011-03-02 ENCOUNTER — Ambulatory Visit (INDEPENDENT_AMBULATORY_CARE_PROVIDER_SITE_OTHER): Payer: Medicare Other

## 2011-03-02 DIAGNOSIS — I4891 Unspecified atrial fibrillation: Secondary | ICD-10-CM

## 2011-04-01 ENCOUNTER — Ambulatory Visit (INDEPENDENT_AMBULATORY_CARE_PROVIDER_SITE_OTHER): Payer: Medicare Other | Admitting: *Deleted

## 2011-04-01 DIAGNOSIS — I4891 Unspecified atrial fibrillation: Secondary | ICD-10-CM

## 2011-04-08 ENCOUNTER — Telehealth: Payer: Self-pay | Admitting: Internal Medicine

## 2011-04-08 NOTE — Telephone Encounter (Signed)
Scheduled an appointment for pacer check and office visit with  Dr. Patty Sermons on 3/8

## 2011-04-08 NOTE — Telephone Encounter (Signed)
Yesterday he was out with friends per wife and he had a few minutes where he was "out of it".  Patients wife feels his pacemaker is not working properly (generator change out 2/12) and this was why episode happened.  Patients wife stated she did not think it was a TIA secondary to patient being on Coumadin.  Did offer an appointment on Friday with  Dr. Patty Sermons however she feels patient needs to see Dr Johney Frame.  Will forward to  Dr. Patty Sermons for review

## 2011-04-08 NOTE — Telephone Encounter (Signed)
New Msg: Pt wife calling wanting to speak with nurse regarding some recent problems pt has been having. Please return pt call to discuss further.  Alt # [phone]

## 2011-04-08 NOTE — Telephone Encounter (Signed)
Okay to refer patient to Dr. Jenel Lucks pacemaker clinic to check his pacemaker

## 2011-04-10 ENCOUNTER — Encounter: Payer: Self-pay | Admitting: Cardiology

## 2011-04-10 ENCOUNTER — Encounter: Payer: Self-pay | Admitting: Internal Medicine

## 2011-04-10 ENCOUNTER — Ambulatory Visit (INDEPENDENT_AMBULATORY_CARE_PROVIDER_SITE_OTHER): Payer: Medicare Other | Admitting: *Deleted

## 2011-04-10 ENCOUNTER — Ambulatory Visit (INDEPENDENT_AMBULATORY_CARE_PROVIDER_SITE_OTHER): Payer: Medicare Other | Admitting: Cardiology

## 2011-04-10 VITALS — BP 118/70 | HR 80 | Ht 70.0 in | Wt 187.0 lb

## 2011-04-10 DIAGNOSIS — I359 Nonrheumatic aortic valve disorder, unspecified: Secondary | ICD-10-CM

## 2011-04-10 DIAGNOSIS — I4891 Unspecified atrial fibrillation: Secondary | ICD-10-CM

## 2011-04-10 DIAGNOSIS — I495 Sick sinus syndrome: Secondary | ICD-10-CM

## 2011-04-10 DIAGNOSIS — E7800 Pure hypercholesterolemia, unspecified: Secondary | ICD-10-CM

## 2011-04-10 DIAGNOSIS — G459 Transient cerebral ischemic attack, unspecified: Secondary | ICD-10-CM

## 2011-04-10 LAB — PACEMAKER DEVICE OBSERVATION
AL IMPEDENCE PM: 551 Ohm
ATRIAL PACING PM: 44.3
BRDY-0004RV: 130 {beats}/min
RV LEAD IMPEDENCE PM: 461 Ohm
VENTRICULAR PACING PM: 21.7

## 2011-04-10 NOTE — Assessment & Plan Note (Signed)
Patient has a history of hypercholesterolemia and is on Vytorin.  He is not having any myalgias or side effects from the Vytorin.

## 2011-04-10 NOTE — Assessment & Plan Note (Signed)
The patient has a history of aortic sclerosis.  We're updating his echocardiogram in view of his recent questionable TIA.

## 2011-04-10 NOTE — Progress Notes (Signed)
Pacer check in clinic  

## 2011-04-10 NOTE — Progress Notes (Signed)
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  Several days ago while the patient was sitting at a coffee shop with a friend he had a short episode where he became confused and was unable to speak clearly.  The friend drove the patient home.  By the time he got home his symptoms had cleared.  His wife was not home at the time.  He has had no recurrence since.  His wife was concerned that perhaps his pacemaker was not working properly.  We had the pacemaker interrogated today by the EP staff and it shows normal pacemaker function.  The patient has a past history of tachybradycardia syndrome and underlying atrial fibrillation.  His new pace generator was replaced on 04/08/10 by Dr. Johney Frame the patient has a history of diastolic dysfunction.  He had an echocardiogram in 2009.  He had a normal adenosine Cardiolite stress test in 2003.  He does not have any history of ischemic heart disease.  He has a history of hypercholesterolemia and a history of Parkinson's disease and depression.  His neurologist is Dr. Sandria Manly  Current Outpatient Prescriptions  Medication Sig Dispense Refill  . aspirin 81 MG tablet Take 81 mg by mouth daily.        . carbidopa-levodopa (SINEMET) 25-250 MG per tablet Take 1 tablet by mouth 4 (four) times daily.        . Cholecalciferol (VITAMIN D PO) Take by mouth.       . ezetimibe-simvastatin (VYTORIN) 10-20 MG per tablet Take 1 tablet by mouth at bedtime.        . gabapentin (NEURONTIN) 300 MG capsule Take 300 mg by mouth 4 (four) times daily.        Marland Kitchen lithium 300 MG capsule Take 300 mg by mouth 1 dose over 46 hours.        . metoprolol tartrate (LOPRESSOR) 25 MG tablet Take 25 mg by mouth 2 (two) times daily.        . multivitamin (THERAGRAN) per tablet  Take 1 tablet by mouth daily.        . Oxcarbazepine (TRILEPTAL) 300 MG tablet Take 300 mg by mouth 4 (four) times daily.       . rivastigmine (EXELON) 4.6 mg/24hr Place 1 patch onto the skin daily.        Marland Kitchen warfarin (COUMADIN) 5 MG tablet Take 1 tablet (5 mg total) by mouth as directed.  40 tablet  3    Allergies  Allergen Reactions  . Penicillins     Patient Active Problem List  Diagnoses  . HYPERCHOLESTEROLEMIA  . BIPOLAR AFFECTIVE DISORDER  . PARKINSON'S DISEASE  . ESSENTIAL HYPERTENSION, BENIGN  . PRIMARY PULMONARY HYPERTENSION  . AORTIC VALVE SCLEROSIS  . Atrial fibrillation  . BRADYCARDIA-TACHYCARDIA SYNDROME  . SLEEP APNEA  . PERSONAL HISTORY MALIGNANT NEOPLASM PROSTATE    History  Smoking status  . Never Smoker   Smokeless tobacco  . Not on file    History  Alcohol Use No    No family history on file.  Review of Systems: Constitutional: no fever chills diaphoresis or fatigue or change in weight.  Head and neck: no hearing loss, no epistaxis, no photophobia or visual disturbance. Respiratory: No cough, shortness of  breath or wheezing. Cardiovascular: No chest pain peripheral edema, palpitations. Gastrointestinal: No abdominal distention, no abdominal pain, no change in bowel habits hematochezia or melena. Genitourinary: No dysuria, no frequency, no urgency, no nocturia. Musculoskeletal:No arthralgias, no back pain, no gait disturbance or myalgias. Neurological: No dizziness, no headaches, no numbness, no seizures, no syncope, no weakness, no tremors. Hematologic: No lymphadenopathy, no easy bruising. Psychiatric: No confusion, no hallucinations, no sleep disturbance.    Physical Exam: Filed Vitals:   04/10/11 1441  BP: 118/70  Pulse: 80   the general appearance reveals a somewhat lethargic elderly gentleman in no distress.  Skin is warm and dry.  The head and neck revealed no carotid bruits.The chest is clear to percussion and auscultation. There are  no rales or rhonchi. Expansion of the chest is symmetrical.  The heart reveals a soft systolic ejection murmur at the base.The abdomen is soft and nontender. Bowel sounds are normal. The liver and spleen are not enlarged. There Are no abdominal masses. There are no bruits.  The pedal pulses are good.  There is no phlebitis or edema.  There is no cyanosis or clubbing. Strength is normal and symmetrical in all extremities.  There is no lateralizing weakness.  There are no sensory deficits.  He does have a movement disorder of Parkinson   EKG shows atrial flutter fibrillation with controlled ventricular response.  He has a right bundle branch block.  Occasional ventricular paced beats are seen appropriately.  Assessment / Plan: His pacemaker is working normally.  We will work him up further for possible TIA with carotid Doppler and with updating his two-dimensional echocardiogram.  Recheck at his regular appointment in about 5 months or sooner when necessary.  Continue baby aspirin and continue long-term Coumadin.  He will keep his upcoming appointment with his neurologist.

## 2011-04-10 NOTE — Assessment & Plan Note (Signed)
The patient is not aware of any palpitations.  His electrocardiogram today shows atrial fibrillation with a controlled ventricular response and ventricular paced beats

## 2011-04-10 NOTE — Patient Instructions (Signed)
Your physician recommends that you continue on your current medications as directed. Please refer to the Current Medication list given to you today.  Your physician has requested that you have an echocardiogram. Echocardiography is a painless test that uses sound waves to create images of your heart. It provides your doctor with information about the size and shape of your heart and how well your heart's chambers and valves are working. This procedure takes approximately one hour. There are no restrictions for this procedure.  Your physician has requested that you have a carotid duplex. This test is an ultrasound of the carotid arteries in your neck. It looks at blood flow through these arteries that supply the brain with blood. Allow one hour for this exam. There are no restrictions or special instructions.

## 2011-04-15 ENCOUNTER — Ambulatory Visit (INDEPENDENT_AMBULATORY_CARE_PROVIDER_SITE_OTHER): Payer: Medicare Other | Admitting: *Deleted

## 2011-04-15 DIAGNOSIS — I4891 Unspecified atrial fibrillation: Secondary | ICD-10-CM

## 2011-04-15 LAB — POCT INR: INR: 2.4

## 2011-04-16 ENCOUNTER — Ambulatory Visit (HOSPITAL_COMMUNITY): Payer: Medicare Other | Attending: Cardiology

## 2011-04-16 ENCOUNTER — Other Ambulatory Visit: Payer: Self-pay

## 2011-04-16 DIAGNOSIS — I079 Rheumatic tricuspid valve disease, unspecified: Secondary | ICD-10-CM | POA: Insufficient documentation

## 2011-04-16 DIAGNOSIS — G20A1 Parkinson's disease without dyskinesia, without mention of fluctuations: Secondary | ICD-10-CM | POA: Insufficient documentation

## 2011-04-16 DIAGNOSIS — I059 Rheumatic mitral valve disease, unspecified: Secondary | ICD-10-CM | POA: Insufficient documentation

## 2011-04-16 DIAGNOSIS — G20 Parkinson's disease: Secondary | ICD-10-CM | POA: Insufficient documentation

## 2011-04-16 DIAGNOSIS — G459 Transient cerebral ischemic attack, unspecified: Secondary | ICD-10-CM

## 2011-04-16 DIAGNOSIS — I379 Nonrheumatic pulmonary valve disorder, unspecified: Secondary | ICD-10-CM | POA: Insufficient documentation

## 2011-04-22 ENCOUNTER — Telehealth: Payer: Self-pay | Admitting: *Deleted

## 2011-04-22 NOTE — Telephone Encounter (Signed)
Advised wife  

## 2011-04-22 NOTE — Telephone Encounter (Signed)
Message copied by Burnell Blanks on Wed Apr 22, 2011  3:16 PM ------      Message from: Cassell Clement      Created: Sat Apr 18, 2011  5:11 PM       Please report.  Echo shows normal LV systolic function with EF 55-65%.  No blood clots seen to account for his TIA.  Continue present meds.

## 2011-04-23 ENCOUNTER — Encounter (INDEPENDENT_AMBULATORY_CARE_PROVIDER_SITE_OTHER): Payer: Medicare Other

## 2011-04-23 DIAGNOSIS — G459 Transient cerebral ischemic attack, unspecified: Secondary | ICD-10-CM

## 2011-04-23 DIAGNOSIS — I6529 Occlusion and stenosis of unspecified carotid artery: Secondary | ICD-10-CM

## 2011-04-29 ENCOUNTER — Ambulatory Visit (INDEPENDENT_AMBULATORY_CARE_PROVIDER_SITE_OTHER): Payer: Medicare Other | Admitting: *Deleted

## 2011-04-29 DIAGNOSIS — I4891 Unspecified atrial fibrillation: Secondary | ICD-10-CM

## 2011-04-29 LAB — POCT INR: INR: 2.4

## 2011-04-30 ENCOUNTER — Telehealth: Payer: Self-pay | Admitting: *Deleted

## 2011-04-30 NOTE — Telephone Encounter (Signed)
Advised wife of results.  

## 2011-04-30 NOTE — Telephone Encounter (Signed)
Message copied by Burnell Blanks on Thu Apr 30, 2011  2:21 PM ------      Message from: Cassell Clement      Created: Wed Apr 29, 2011  9:59 PM       Please report.  Mild plaque but no flow limiting stenosis.      Continue current Rx

## 2011-05-27 ENCOUNTER — Ambulatory Visit (INDEPENDENT_AMBULATORY_CARE_PROVIDER_SITE_OTHER): Payer: Medicare Other | Admitting: *Deleted

## 2011-05-27 DIAGNOSIS — I4891 Unspecified atrial fibrillation: Secondary | ICD-10-CM

## 2011-06-04 ENCOUNTER — Telehealth: Payer: Self-pay | Admitting: Nurse Practitioner

## 2011-06-04 ENCOUNTER — Telehealth: Payer: Self-pay | Admitting: *Deleted

## 2011-06-04 NOTE — Telephone Encounter (Signed)
Pts wife called stating her husband had had tooth extraction yesterday and had been oozing blood all night and had been using gauze pads as directed by Dentist . Nurse asked pts wife if his coumadin had been held prior to extraction and she states it was not and she did not know if Dentist even knew he was on Coumadin Pt wife asked if she should hold coumadin dose today and nurse instructed her to call Dentist as soon as he opens today and inform of pts oozing blood and that he is on Coumadin  and not to hold Coumadin unless directed by MD or if can come in to have INR checked today  and she states understanding

## 2011-06-04 NOTE — Telephone Encounter (Deleted)
New Problem:     Patient's husband returned your call. Please call back. 

## 2011-06-22 ENCOUNTER — Ambulatory Visit (INDEPENDENT_AMBULATORY_CARE_PROVIDER_SITE_OTHER): Payer: Medicare Other | Admitting: Pharmacist

## 2011-06-22 DIAGNOSIS — I4891 Unspecified atrial fibrillation: Secondary | ICD-10-CM

## 2011-07-09 ENCOUNTER — Encounter: Payer: Self-pay | Admitting: Internal Medicine

## 2011-07-09 ENCOUNTER — Ambulatory Visit (INDEPENDENT_AMBULATORY_CARE_PROVIDER_SITE_OTHER): Payer: Medicare Other | Admitting: Internal Medicine

## 2011-07-09 VITALS — BP 120/80 | HR 72 | Ht 70.0 in | Wt 187.1 lb

## 2011-07-09 DIAGNOSIS — I495 Sick sinus syndrome: Secondary | ICD-10-CM

## 2011-07-09 DIAGNOSIS — I4891 Unspecified atrial fibrillation: Secondary | ICD-10-CM

## 2011-07-09 LAB — PACEMAKER DEVICE OBSERVATION
ATRIAL PACING PM: 94
RV LEAD IMPEDENCE PM: 443 Ohm
VENTRICULAR PACING PM: 55

## 2011-07-09 NOTE — Assessment & Plan Note (Signed)
Normal pacemaker function See Pace Art report No changes today  

## 2011-07-09 NOTE — Patient Instructions (Signed)
Remote monitoring is used to monitor your Pacemaker of ICD from home. This monitoring reduces the number of office visits required to check your device to one time per year. It allows [place] to keep an eye on the functioning of your device to ensure it is working properly. You are scheduled for a device check from home on 10/15/2011. You may send your transmission at any time that day. If you have a wireless device, the transmission will be sent automatically. After your physician reviews your transmission, you will receive a postcard with your next transmission date.  Your physician wants you to follow-up in: 1 year with Dr. Johney Frame. You will receive a reminder letter in the mail two months in advance. If you don't receive a letter, please call our office to schedule the follow-up appointment.

## 2011-07-09 NOTE — Assessment & Plan Note (Signed)
Continue coumadin, goal INR 2-3 We discussed pros/ cons of coumadin/ pradaxa/ xarelto/ and apixaban today.  Due to concerns of "the donut hole" they prefer to stay with coumadin at this time.

## 2011-07-09 NOTE — Progress Notes (Signed)
PCP: Kari Baars, MD, MD Primary Cardiologist Dr Patty Sermons  The patient presents today for routine electrophysiology followup. He is doing well at this time.  He presented 3/13 to Dr Patty Sermons with complaints suggestive of TIA.  These have resolved.  He is taking coumadin without difficulty.  Today, he denies symptoms of palpitations, chest pain, shortness of breath, orthopnea, PND, lower extremity edema, dizziness, presyncope, or syncope.  The patient feels that he is tolerating medications without difficulties and is otherwise without complaint today.   Past Medical History  Diagnosis Date  . Prostate cancer     with radiation  . Bipolar 1 disorder   . Coronary artery disease   . Atrial fibrillation     persistent, with atypical atrial flutter  . Tachycardia-bradycardia     s/p PPM by Dr Reyes Ivan, Gen change by Fawn Kirk 3/12  . Parkinson's disease   . Hypertension   . AAA (abdominal aortic aneurysm)     medical management  . Dementia     mild   Past Surgical History  Procedure Date  . Nasal sinus surgery   . Pacemaker insertion 04/08/2010    Initial pacemaker by Dr Reyes Ivan, gen change by Fawn Kirk (MDT) 3/12    Current Outpatient Prescriptions  Medication Sig Dispense Refill  . aspirin 81 MG tablet Take 81 mg by mouth daily.        . carbidopa-levodopa (SINEMET) 25-250 MG per tablet Take 1 tablet by mouth 4 (four) times daily.        . Cholecalciferol (VITAMIN D PO) Take 50,000 Units by mouth once a week.       . ezetimibe-simvastatin (VYTORIN) 10-20 MG per tablet Take 1 tablet by mouth daily.       Marland Kitchen gabapentin (NEURONTIN) 300 MG capsule Take 300 mg by mouth 4 (four) times daily.        . hydrOXYzine (VISTARIL) 25 MG capsule Take 50 mg by mouth at bedtime.       Marland Kitchen lithium 300 MG capsule Take 300 mg by mouth 1 day or 1 dose.        . metoprolol tartrate (LOPRESSOR) 25 MG tablet Take 25 mg by mouth 2 (two) times daily.        . multivitamin (THERAGRAN) per tablet Take 1 tablet by mouth  daily.        . Oxcarbazepine (TRILEPTAL) 300 MG tablet Take 300 mg by mouth 4 (four) times daily.       . rivastigmine (EXELON) 4.6 mg/24hr Place 1 patch onto the skin daily.        Marland Kitchen warfarin (COUMADIN) 5 MG tablet Take 1 tablet (5 mg total) by mouth as directed.  40 tablet  3    Allergies  Allergen Reactions  . Penicillins     History   Social History  . Marital Status: Married    Spouse Name: N/A    Number of Children: N/A  . Years of Education: N/A   Occupational History  . Not on file.   Social History Main Topics  . Smoking status: Never Smoker   . Smokeless tobacco: Not on file  . Alcohol Use: No  . Drug Use: No  . Sexually Active: Not on file   Other Topics Concern  . Not on file   Social History Narrative  . No narrative on file    Physical Exam: Filed Vitals:   07/09/11 1140  BP: 120/80  Pulse: 72  Height: 5\' 10"  (1.778 m)  Weight:  187 lb 1.9 oz (84.877 kg)    GEN- The patient is elderly appearing, alert and oriented x 3 today.   Head- normocephalic, atraumatic Eyes-  Sclera clear, conjunctiva pink Ears- hearing intact Oropharynx- clear Neck- supple, no JVP Lymph- no cervical lymphadenopathy Lungs- Clear to ausculation bilaterally, normal work of breathing Chest- pacemaker pocket is well healed Heart- Regular rate and rhythm, no murmurs, rubs or gallops, PMI not laterally displaced GI- soft, NT, ND, + BS Extremities- no clubbing, cyanosis, or edema   Pacemaker interrogation- reviewed in detail today,  See PACEART report  Assessment and Plan:

## 2011-08-04 ENCOUNTER — Encounter: Payer: Self-pay | Admitting: Cardiology

## 2011-08-04 ENCOUNTER — Ambulatory Visit (INDEPENDENT_AMBULATORY_CARE_PROVIDER_SITE_OTHER): Payer: Medicare Other | Admitting: Cardiology

## 2011-08-04 ENCOUNTER — Ambulatory Visit (INDEPENDENT_AMBULATORY_CARE_PROVIDER_SITE_OTHER): Payer: Medicare Other | Admitting: *Deleted

## 2011-08-04 VITALS — BP 123/76 | HR 70 | Ht 70.0 in | Wt 185.8 lb

## 2011-08-04 DIAGNOSIS — I4891 Unspecified atrial fibrillation: Secondary | ICD-10-CM

## 2011-08-04 DIAGNOSIS — E7800 Pure hypercholesterolemia, unspecified: Secondary | ICD-10-CM

## 2011-08-04 DIAGNOSIS — G20 Parkinson's disease: Secondary | ICD-10-CM

## 2011-08-04 LAB — POCT INR: INR: 1.8

## 2011-08-04 NOTE — Assessment & Plan Note (Signed)
The patient has a history of atrial fibrillation.  He is on chronic Coumadin.  His prothrombin times have been reasonably stable.  He has had no TIA symptoms.

## 2011-08-04 NOTE — Assessment & Plan Note (Signed)
The patient is on Vytorin for his hypercholesterolemia.  He is not having any myalgias.  We will plan to check fasting lab work at his next visit in 4 months

## 2011-08-04 NOTE — Progress Notes (Signed)
Osborne Oman Coulibaly Date of Birth:  1927-02-21 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  He has a past history of tachybradycardia syndrome and underlying atrial fibrillation.  He has a history of hypercholesterolemia.  He is also had Parkinson's disease and depression.  We saw him as a work in on 04/10/11 after he had a spell while sitting in a coffee shop with a friend.  He became temporarily confused and was unable to speak clearly the friend drove the patient home and by the time he got home his symptoms have cleared.  These have not recurred.  His pacemaker was interrogated when last seen on 04/10/11 and showed normal pacemaker function.  The patient had an echocardiogram 04/16/11 showing normal ejection fraction of 55-65% and mild mitral regurgitation and no evidence of any thrombi.  Current Outpatient Prescriptions  Medication Sig Dispense Refill  . aspirin 81 MG tablet Take 81 mg by mouth daily.        . carbidopa-levodopa (SINEMET) 25-250 MG per tablet Take 1 tablet by mouth 4 (four) times daily.        . Cholecalciferol (VITAMIN D PO) Take 50,000 Units by mouth once a week.       . ezetimibe-simvastatin (VYTORIN) 10-20 MG per tablet Take 1 tablet by mouth daily.       Marland Kitchen gabapentin (NEURONTIN) 300 MG capsule Take 300 mg by mouth 4 (four) times daily.        . hydrOXYzine (VISTARIL) 25 MG capsule Take 50 mg by mouth at bedtime.       Marland Kitchen lithium 300 MG capsule Take 300 mg by mouth 1 day or 1 dose.        . metoprolol tartrate (LOPRESSOR) 25 MG tablet Take 25 mg by mouth 2 (two) times daily.        . multivitamin (THERAGRAN) per tablet Take 1 tablet by mouth daily.        . Oxcarbazepine (TRILEPTAL) 300 MG tablet Take 300 mg by mouth 4 (four) times daily.       . rivastigmine (EXELON) 4.6  mg/24hr Place 1 patch onto the skin daily.        Marland Kitchen warfarin (COUMADIN) 5 MG tablet Take 1 tablet (5 mg total) by mouth as directed.  40 tablet  3    Allergies  Allergen Reactions  . Penicillins     Patient Active Problem List  Diagnosis  . HYPERCHOLESTEROLEMIA  . BIPOLAR AFFECTIVE DISORDER  . PARKINSON'S DISEASE  . ESSENTIAL HYPERTENSION, BENIGN  . PRIMARY PULMONARY HYPERTENSION  . AORTIC VALVE SCLEROSIS  . Atrial fibrillation  . BRADYCARDIA-TACHYCARDIA SYNDROME  . SLEEP APNEA  . PERSONAL HISTORY MALIGNANT NEOPLASM PROSTATE    History  Smoking status  . Never Smoker   Smokeless tobacco  . Not on file    History  Alcohol Use No    No family history on file.  Review of Systems: Constitutional: no fever chills diaphoresis or fatigue or change in weight.  Head and neck: no hearing loss, no epistaxis, no photophobia or visual disturbance. Respiratory: No cough, shortness of breath or wheezing. Cardiovascular: No chest pain peripheral edema, palpitations. Gastrointestinal: No abdominal distention, no abdominal pain, no change in bowel habits hematochezia or melena. Genitourinary: No dysuria, no frequency, no urgency, no  nocturia. Musculoskeletal:No arthralgias, no back pain, no gait disturbance or myalgias. Neurological: No dizziness, no headaches, no numbness, no seizures, no syncope, no weakness, no tremors. Hematologic: No lymphadenopathy, no easy bruising. Psychiatric: No confusion, no hallucinations, no sleep disturbance.    Physical Exam: Filed Vitals:   08/04/11 1008  BP: 123/76  Pulse: 70   the general appearance reveals a pleasant elderly gentleman in no distress.The head and neck exam reveals pupils equal and reactive.  Extraocular movements are full.  There is no scleral icterus.  The mouth and pharynx are normal.  The neck is supple.  The carotids reveal no bruits.  The jugular venous pressure is normal.  The  thyroid is not enlarged.  There is no  lymphadenopathy.  The chest is clear to percussion and auscultation.  There are no rales or rhonchi.  Expansion of the chest is symmetrical.  The precordium is quiet.  The first heart sound is normal.  The second heart sound is physiologically split.  There is no murmur gallop rub or click.  There is no abnormal lift or heave.  The abdomen is soft and nontender.  The bowel sounds are normal.  The liver and spleen are not enlarged.  There are no abdominal masses.  There are no abdominal bruits.  Extremities reveal good pedal pulses.  There is no phlebitis or edema.  There is no cyanosis or clubbing.  Strength is normal and symmetrical in all extremities.  There is very minimal tremor.  There is no lateralizing weakness.  There are no sensory deficits.  The skin is warm and dry.  There is no rash.     Assessment / Plan: The patient is to continue same medication.  Recheck in 4 months for followup office visit lipid panel hepatic function panel and basal metabolic panel.

## 2011-08-04 NOTE — Assessment & Plan Note (Signed)
The patient's Parkinson's symptoms have been stable.  He does walk with a cane.  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

## 2011-08-04 NOTE — Patient Instructions (Addendum)
Your physician recommends that you continue on your current medications as directed. Please refer to the Current Medication list given to you today.  Your physician wants you to follow-up in: 4 months with fasting labs (lp/bmet/hfp) You will receive a reminder letter in the mail two months in advance. If you don't receive a letter, please call our office to schedule the follow-up appointment.  

## 2011-08-11 ENCOUNTER — Ambulatory Visit: Payer: Medicare Other

## 2011-08-11 ENCOUNTER — Ambulatory Visit (INDEPENDENT_AMBULATORY_CARE_PROVIDER_SITE_OTHER): Payer: Medicare Other | Admitting: Family Medicine

## 2011-08-11 VITALS — BP 118/72 | HR 86 | Temp 99.0°F | Resp 18 | Ht 68.0 in | Wt 181.0 lb

## 2011-08-11 DIAGNOSIS — W1800XA Striking against unspecified object with subsequent fall, initial encounter: Secondary | ICD-10-CM

## 2011-08-11 DIAGNOSIS — R079 Chest pain, unspecified: Secondary | ICD-10-CM

## 2011-08-11 DIAGNOSIS — R0781 Pleurodynia: Secondary | ICD-10-CM

## 2011-08-11 DIAGNOSIS — S20219A Contusion of unspecified front wall of thorax, initial encounter: Secondary | ICD-10-CM

## 2011-08-11 DIAGNOSIS — R071 Chest pain on breathing: Secondary | ICD-10-CM

## 2011-08-11 NOTE — Progress Notes (Signed)
[facility]:  Office Visit  Chief Complaint:  Chief Complaint  Patient presents with  . Fall    4 days ago    HPI: Jacob Boyd is a 78 y.o. male who complains of  Left rib pain s/p fall 4 days ago. Pt has Parkinsons with dementia so he has trouble walking and sometimes has a miss step. Fell on side. Denies head trauma, acute AMS, shoulder, arm, hip pain.  Sharp pain with deep breaths. Denies CP, SOB, dizziness   Past Medical History  Diagnosis Date  . Prostate cancer     with radiation  . Bipolar 1 disorder   . Coronary artery disease   . Atrial fibrillation     persistent, with atypical atrial flutter  . Tachycardia-bradycardia     s/p PPM by Dr Reyes Ivan, Gen change by Fawn Kirk 3/12  . Parkinson's disease   . Hypertension   . AAA (abdominal aortic aneurysm)     medical management  . Dementia     mild   Past Surgical History  Procedure Date  . Nasal sinus surgery   . Pacemaker insertion 04/08/2010    Initial pacemaker by Dr Reyes Ivan, gen change by Fawn Kirk (MDT) 3/12   History   Social History  . Marital Status: Married    Spouse Name: N/A    Number of Children: N/A  . Years of Education: N/A   Social History Main Topics  . Smoking status: Never Smoker   . Smokeless tobacco: None  . Alcohol Use: No  . Drug Use: No  . Sexually Active: None   Other Topics Concern  . None   Social History Narrative  . None   No family history on file. Allergies  Allergen Reactions  . Penicillins    Prior to Admission medications   Medication Sig Start Date End Date Taking? Authorizing Provider  aspirin 81 MG tablet Take 81 mg by mouth daily.     Yes Historical Provider, MD  carbidopa-levodopa (SINEMET) 25-250 MG per tablet Take 1 tablet by mouth 4 (four) times daily.     Yes Historical Provider, MD  Cholecalciferol (VITAMIN D PO) Take 50,000 Units by mouth once a week.    Yes Historical Provider, MD  ezetimibe-simvastatin (VYTORIN) 10-20 MG per tablet Take 1  tablet by mouth daily.    Yes Historical Provider, MD  gabapentin (NEURONTIN) 300 MG capsule Take 300 mg by mouth 4 (four) times daily.     Yes Historical Provider, MD  hydrOXYzine (VISTARIL) 25 MG capsule Take 50 mg by mouth at bedtime.  06/16/11  Yes Historical Provider, MD  lithium 300 MG capsule Take 300 mg by mouth 1 day or 1 dose.     Yes Historical Provider, MD  metoprolol tartrate (LOPRESSOR) 25 MG tablet Take 25 mg by mouth 2 (two) times daily.     Yes Historical Provider, MD  multivitamin [facility]) per tablet Take 1 tablet by mouth daily.     Yes Historical Provider, MD  Oxcarbazepine (TRILEPTAL) 300 MG tablet Take 300 mg by mouth 4 (four) times daily.    Yes Historical Provider, MD  rivastigmine (EXELON) 4.6 mg/24hr Place 1 patch onto the skin daily.     Yes Historical Provider, MD  warfarin (COUMADIN) 5 MG tablet Take 1 tablet (5 mg total) by mouth as directed. 12/29/10  Yes Cassell Clement, MD     ROS: The patient denies fevers, chills, night sweats, unintentional weight loss, chest pain, palpitations, wheezing, dyspnea  on exertion, nausea, vomiting, abdominal pain, dysuria, hematuria, melena, numbness, weakness, or tingling.   All other systems have been reviewed and were otherwise negative with the exception of those mentioned in the HPI and as above.    PHYSICAL EXAM: Filed Vitals:   08/11/11 1816  BP: 118/72  Pulse: 86  Temp: 99 F (37.2 C)  Resp: 18   Filed Vitals:   08/11/11 1816  Height: 5\' 8"  (1.727 m)  Weight: 181 lb (82.101 kg)   Body mass index is 27.52 kg/(m^2).  General: Alert, no acute distress HEENT:  Normocephalic, atraumatic, oropharynx patent.  Cardiovascular:  Regular rate and rhythm, no rubs  or gallops.  + systolic murmur. No Carotid bruits, radial pulse intact. No pedal edema.  Respiratory: Clear to auscultation bilaterally.  No wheezes, rales, or rhonchi.  No cyanosis, no use of accessory musculature GI: No organomegaly, abdomen is soft and  non-tender, positive bowel sounds.  No masses. Skin: No rashes. Neurologic: Facial musculature symmetric. Psychiatric: Patient is appropriate throughout our interaction. Lymphatic: No cervical lymphadenopathy Musculoskeletal: Gait slowed, uses cane.   LABS: Results for orders placed in visit on 08/04/11  POCT INR      Component Value Range   INR 1.8       EKG/XRAY:   Primary read interpreted by Dr. Conley Rolls at [facility]. No appreciable rib fractures, no pneumothorax, no infiltrate, + pacemaker   ASSESSMENT/PLAN: Encounter Diagnoses  Name Primary?  . Rib pain Yes  . Fall against object   . Pleuritic chest pain    Rib contusion Continue with Tylenol prn for pain    Kalin Kyler PHUONG, DO 08/11/2011 6:38 PM

## 2011-08-22 ENCOUNTER — Other Ambulatory Visit: Payer: Self-pay | Admitting: Cardiology

## 2011-09-01 ENCOUNTER — Ambulatory Visit (INDEPENDENT_AMBULATORY_CARE_PROVIDER_SITE_OTHER): Payer: Medicare Other | Admitting: *Deleted

## 2011-09-01 DIAGNOSIS — I4891 Unspecified atrial fibrillation: Secondary | ICD-10-CM

## 2011-09-01 LAB — POCT INR: INR: 1.8

## 2011-09-14 ENCOUNTER — Ambulatory Visit (INDEPENDENT_AMBULATORY_CARE_PROVIDER_SITE_OTHER): Payer: Medicare Other | Admitting: *Deleted

## 2011-09-14 DIAGNOSIS — I4891 Unspecified atrial fibrillation: Secondary | ICD-10-CM

## 2011-10-06 ENCOUNTER — Ambulatory Visit (INDEPENDENT_AMBULATORY_CARE_PROVIDER_SITE_OTHER): Payer: Medicare Other | Admitting: Pharmacist

## 2011-10-06 DIAGNOSIS — I4891 Unspecified atrial fibrillation: Secondary | ICD-10-CM

## 2011-10-06 LAB — POCT INR: INR: 2.2

## 2011-10-15 ENCOUNTER — Encounter: Payer: Medicare Other | Admitting: *Deleted

## 2011-10-30 ENCOUNTER — Encounter: Payer: Self-pay | Admitting: *Deleted

## 2011-11-03 ENCOUNTER — Ambulatory Visit (INDEPENDENT_AMBULATORY_CARE_PROVIDER_SITE_OTHER): Payer: Medicare Other | Admitting: *Deleted

## 2011-11-03 DIAGNOSIS — I4891 Unspecified atrial fibrillation: Secondary | ICD-10-CM

## 2011-11-03 LAB — POCT INR: INR: 2.3

## 2011-11-10 ENCOUNTER — Telehealth: Payer: Self-pay | Admitting: Internal Medicine

## 2011-11-10 NOTE — Telephone Encounter (Signed)
Instructions given to wife for sending [employer].  They will attempt later today.

## 2011-11-10 NOTE — Telephone Encounter (Signed)
New Problem:    Patient wife called in wanting someone to update [employer] so her husband can resume sending his remote pacer checks in.  Patient's wife claims that [employer] claims that someone in our office neglected to update them when the patient had his battery changed last year.  Please call back and leave a message and she will send a transmission.

## 2011-11-11 ENCOUNTER — Telehealth: Payer: Self-pay | Admitting: Cardiology

## 2011-11-11 NOTE — Telephone Encounter (Signed)
Left message for patient new transmitter and return box ordered.

## 2011-11-11 NOTE — Telephone Encounter (Signed)
Pt remote transmission box is broken, tech support said pt will need another box. plz return call to wife to advise [phone]

## 2011-11-17 ENCOUNTER — Ambulatory Visit (INDEPENDENT_AMBULATORY_CARE_PROVIDER_SITE_OTHER): Payer: Medicare Other | Admitting: *Deleted

## 2011-11-17 ENCOUNTER — Encounter: Payer: Self-pay | Admitting: Internal Medicine

## 2011-11-17 DIAGNOSIS — I495 Sick sinus syndrome: Secondary | ICD-10-CM

## 2011-11-20 LAB — REMOTE PACEMAKER DEVICE
AL IMPEDENCE PM: 526 Ohm
BATTERY VOLTAGE: 2.8 V
RV LEAD AMPLITUDE: 16 mv
RV LEAD IMPEDENCE PM: 434 Ohm
RV LEAD THRESHOLD: 0.625 V
VENTRICULAR PACING PM: 49

## 2011-11-30 ENCOUNTER — Encounter: Payer: Self-pay | Admitting: *Deleted

## 2011-12-15 ENCOUNTER — Ambulatory Visit (INDEPENDENT_AMBULATORY_CARE_PROVIDER_SITE_OTHER): Payer: Medicare Other | Admitting: *Deleted

## 2011-12-15 DIAGNOSIS — I4891 Unspecified atrial fibrillation: Secondary | ICD-10-CM

## 2011-12-15 LAB — POCT INR: INR: 1.8

## 2011-12-22 ENCOUNTER — Ambulatory Visit (INDEPENDENT_AMBULATORY_CARE_PROVIDER_SITE_OTHER): Payer: Medicare Other | Admitting: Cardiology

## 2011-12-22 VITALS — BP 130/90 | HR 56 | Ht 70.0 in | Wt 187.0 lb

## 2011-12-22 DIAGNOSIS — I4891 Unspecified atrial fibrillation: Secondary | ICD-10-CM

## 2011-12-22 DIAGNOSIS — E7800 Pure hypercholesterolemia, unspecified: Secondary | ICD-10-CM

## 2011-12-22 DIAGNOSIS — I495 Sick sinus syndrome: Secondary | ICD-10-CM

## 2011-12-22 DIAGNOSIS — I119 Hypertensive heart disease without heart failure: Secondary | ICD-10-CM

## 2011-12-22 DIAGNOSIS — G4730 Sleep apnea, unspecified: Secondary | ICD-10-CM

## 2011-12-22 LAB — CBC WITH DIFFERENTIAL/PLATELET
Basophils Absolute: 0.1 10*3/uL (ref 0.0–0.1)
Basophils Relative: 0.7 % (ref 0.0–3.0)
Eosinophils Relative: 2.1 % (ref 0.0–5.0)
HCT: 41.8 % (ref 39.0–52.0)
Hemoglobin: 13.8 g/dL (ref 13.0–17.0)
Lymphocytes Relative: 12.3 % (ref 12.0–46.0)
Lymphs Abs: 0.9 10*3/uL (ref 0.7–4.0)
Monocytes Relative: 6.5 % (ref 3.0–12.0)
Neutro Abs: 6.1 10*3/uL (ref 1.4–7.7)
RBC: 4.66 Mil/uL (ref 4.22–5.81)
RDW: 13.4 % (ref 11.5–14.6)
WBC: 7.7 10*3/uL (ref 4.5–10.5)

## 2011-12-22 LAB — BASIC METABOLIC PANEL
CO2: 27 mEq/L (ref 19–32)
Chloride: 104 mEq/L (ref 96–112)
Creatinine, Ser: 1.5 mg/dL (ref 0.4–1.5)
Glucose, Bld: 103 mg/dL — ABNORMAL HIGH (ref 70–99)

## 2011-12-22 LAB — LIPID PANEL
Cholesterol: 211 mg/dL — ABNORMAL HIGH (ref 0–200)
Total CHOL/HDL Ratio: 4
Triglycerides: 156 mg/dL — ABNORMAL HIGH (ref 0.0–149.0)

## 2011-12-22 LAB — LDL CHOLESTEROL, DIRECT: Direct LDL: 134.5 mg/dL

## 2011-12-22 LAB — HEPATIC FUNCTION PANEL
ALT: 26 U/L (ref 0–53)
Albumin: 4.3 g/dL (ref 3.5–5.2)
Alkaline Phosphatase: 84 U/L (ref 39–117)
Bilirubin, Direct: 0.1 mg/dL (ref 0.0–0.3)
Total Protein: 7.2 g/dL (ref 6.0–8.3)

## 2011-12-22 NOTE — Patient Instructions (Addendum)
Your physician recommends that you continue on your current medications as directed. Please refer to the Current Medication list given to you today.  Your physician recommends that you schedule a follow-up appointment in: 4 months with fasting labs (lp/bmet/hfp/cbc)   Will obtain labs today and call you with the results (lp/bmet/hfp)

## 2011-12-22 NOTE — Assessment & Plan Note (Signed)
Patient has a history of hypercholesterolemia and is on Vytorin.  Blood work is drawn today and is pending.  His wife would like a copy of the labs.

## 2011-12-22 NOTE — Progress Notes (Signed)
Quick Note:  Please report to patient. The recent labs are stable. Continue same medication and careful diet.No anemia. TG higher. Watch sweets. Cholesterol and LDL higher. Watch diet.CSD. Mail copy to wife. ______

## 2011-12-22 NOTE — Assessment & Plan Note (Signed)
The patient has had successful surgery for his sleep apnea.  Does not have to wear a CPAP machine

## 2011-12-22 NOTE — Assessment & Plan Note (Signed)
The patient has not been aware of any racing or pounding of his heart.  The dizziness or syncope.  He exercises by walking in the house and short distances in his yard when the weather is good

## 2011-12-22 NOTE — Progress Notes (Signed)
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 He has a past history of tachybradycardia syndrome and underlying atrial fibrillation. He has a history of hypercholesterolemia. He is also had Parkinson's disease and depression. We saw him as a work in on 04/10/11 after he had a spell while sitting in a coffee shop with a friend. He became temporarily confused and was unable to speak clearly the friend drove the patient home and by the time he got home his symptoms have cleared. These have not recurred. His pacemaker was interrogated when last seen on 04/10/11 and showed normal pacemaker function. The patient had an echocardiogram 04/16/11 showing normal ejection fraction of 55-65% and mild mitral regurgitation and no evidence of any thrombi.   Current Outpatient Prescriptions  Medication Sig Dispense Refill  . aspirin 81 MG tablet Take 81 mg by mouth daily.        . carbidopa-levodopa (SINEMET) 25-250 MG per tablet Take 1 tablet by mouth 4 (four) times daily.        . Cholecalciferol (VITAMIN D PO) Take 50,000 Units by mouth once a week.       . ezetimibe-simvastatin (VYTORIN) 10-20 MG per tablet Take 1 tablet by mouth daily.       Marland Kitchen gabapentin (NEURONTIN) 300 MG capsule Take 300 mg by mouth 4 (four) times daily.        . hydrOXYzine (VISTARIL) 25 MG capsule Take 50 mg by mouth at bedtime.       Marland Kitchen lithium 300 MG capsule Take 300 mg by mouth 1 day or 1 dose.        . metoprolol tartrate (LOPRESSOR) 25 MG tablet Take 25 mg by mouth 2 (two) times daily.        . multivitamin (THERAGRAN) per tablet Take 1 tablet by mouth daily.        . Oxcarbazepine (TRILEPTAL) 300 MG tablet Take 300 mg by mouth 4 (four) times daily.       . rivastigmine (EXELON) 4.6 mg/24hr  Place 1 patch onto the skin daily.        Marland Kitchen warfarin (COUMADIN) 5 MG tablet TAKE ONE TABLET BY MOUTH AS DIRECTED  40 tablet  3    Allergies  Allergen Reactions  . Penicillins     Patient Active Problem List  Diagnosis  . HYPERCHOLESTEROLEMIA  . BIPOLAR AFFECTIVE DISORDER  . PARKINSON'S DISEASE  . ESSENTIAL HYPERTENSION, BENIGN  . PRIMARY PULMONARY HYPERTENSION  . AORTIC VALVE SCLEROSIS  . Atrial fibrillation  . BRADYCARDIA-TACHYCARDIA SYNDROME  . SLEEP APNEA  . PERSONAL HISTORY MALIGNANT NEOPLASM PROSTATE    History  Smoking status  . Never Smoker   Smokeless tobacco  . Not on file    History  Alcohol Use No    No family history on file.  Review of Systems: Constitutional: no fever chills diaphoresis or fatigue or change in weight.  Head and neck: no hearing loss, no epistaxis, no photophobia or visual disturbance. Respiratory: No cough, shortness of breath or wheezing. Cardiovascular: No chest pain peripheral edema, palpitations. Gastrointestinal: No abdominal distention, no abdominal pain, no change in bowel habits hematochezia or melena. Genitourinary: No dysuria, no frequency, no urgency, no nocturia. Musculoskeletal:No arthralgias, no back pain, no gait disturbance or  myalgias. Neurological: No dizziness, no headaches, no numbness, no seizures, no syncope, no weakness, no tremors. Hematologic: No lymphadenopathy, no easy bruising. Psychiatric: No confusion, no hallucinations, no sleep disturbance.    Physical Exam: Filed Vitals:   12/22/11 1007  BP: 130/90  Pulse: 56   the general appearance reveals a well-developed well-nourished elderly gentleman in no distress.The head and neck exam reveals pupils equal and reactive.  Extraocular movements are full.  There is no scleral icterus.  The mouth and pharynx are normal.  The neck is supple.  The carotids reveal no bruits.  The jugular venous pressure is normal.  The  thyroid is not enlarged.  There is no  lymphadenopathy.  The chest is clear to percussion and auscultation.  There are no rales or rhonchi.  Expansion of the chest is symmetrical.  The precordium is quiet.  The pulse is irregular in atrial  The first heart sound is normal.  The second heart sound is physiologically split.  There is no murmur gallop rub or click.  There is no abnormal lift or heave.  The abdomen is soft and nontender.  The bowel sounds are normal.  The liver and spleen are not enlarged.  There are no abdominal masses.  There are no abdominal bruits.  Extremities reveal good pedal pulses.  There is no phlebitis or edema.  There is no cyanosis or clubbing.  Strength is normal and symmetrical in all extremities.  There is no lateralizing weakness.  There are no sensory deficits.  The skin is warm and dry.  There is no rash.     Assessment / Plan: Continue same medication.  Recheck in 4 months for office visit lipid panel hepatic function panel basal metabolic panel and CBC

## 2011-12-22 NOTE — Assessment & Plan Note (Signed)
The patient had not had any symptoms to suggest TIAs.  The patient has not had any bleeding from his Coumadin.

## 2011-12-25 ENCOUNTER — Telehealth: Payer: Self-pay | Admitting: *Deleted

## 2011-12-25 NOTE — Telephone Encounter (Signed)
Message copied by Burnell Blanks on Fri Dec 25, 2011  1:12 PM ------      Message from: Cassell Clement      Created: Tue Dec 22, 2011  4:32 PM       Please report to patient.  The recent labs are stable. Continue same medication and careful diet.No anemia. TG higher. Watch sweets. Cholesterol and LDL higher. Watch diet.CSD. Mail copy to wife.

## 2011-12-25 NOTE — Telephone Encounter (Signed)
Mailed copy of labs and left message to call if any questions  

## 2012-01-14 ENCOUNTER — Encounter: Payer: Self-pay | Admitting: Vascular Surgery

## 2012-02-22 ENCOUNTER — Encounter (INDEPENDENT_AMBULATORY_CARE_PROVIDER_SITE_OTHER): Payer: Medicare Other | Admitting: *Deleted

## 2012-02-22 DIAGNOSIS — R0989 Other specified symptoms and signs involving the circulatory and respiratory systems: Secondary | ICD-10-CM

## 2012-02-26 ENCOUNTER — Encounter: Payer: Self-pay | Admitting: *Deleted

## 2012-02-29 ENCOUNTER — Other Ambulatory Visit: Payer: Self-pay | Admitting: *Deleted

## 2012-02-29 ENCOUNTER — Other Ambulatory Visit: Payer: Self-pay | Admitting: Neurology

## 2012-02-29 DIAGNOSIS — I4891 Unspecified atrial fibrillation: Secondary | ICD-10-CM

## 2012-02-29 DIAGNOSIS — F0390 Unspecified dementia without behavioral disturbance: Secondary | ICD-10-CM

## 2012-02-29 DIAGNOSIS — I714 Abdominal aortic aneurysm, without rupture: Secondary | ICD-10-CM

## 2012-03-02 ENCOUNTER — Other Ambulatory Visit: Payer: Self-pay | Admitting: Neurology

## 2012-03-02 ENCOUNTER — Ambulatory Visit: Payer: Self-pay | Admitting: Internal Medicine

## 2012-03-02 DIAGNOSIS — Z9181 History of falling: Secondary | ICD-10-CM

## 2012-03-02 DIAGNOSIS — R269 Unspecified abnormalities of gait and mobility: Secondary | ICD-10-CM

## 2012-03-02 DIAGNOSIS — G459 Transient cerebral ischemic attack, unspecified: Secondary | ICD-10-CM

## 2012-03-02 DIAGNOSIS — I4891 Unspecified atrial fibrillation: Secondary | ICD-10-CM

## 2012-03-03 ENCOUNTER — Other Ambulatory Visit: Payer: Self-pay | Admitting: Internal Medicine

## 2012-03-03 ENCOUNTER — Ambulatory Visit (INDEPENDENT_AMBULATORY_CARE_PROVIDER_SITE_OTHER): Payer: Medicare Other | Admitting: *Deleted

## 2012-03-03 ENCOUNTER — Encounter: Payer: Self-pay | Admitting: Internal Medicine

## 2012-03-03 DIAGNOSIS — I495 Sick sinus syndrome: Secondary | ICD-10-CM

## 2012-03-03 DIAGNOSIS — Z950 Presence of cardiac pacemaker: Secondary | ICD-10-CM

## 2012-03-07 ENCOUNTER — Ambulatory Visit
Admission: RE | Admit: 2012-03-07 | Discharge: 2012-03-07 | Disposition: A | Discharge status: Home / Self Care | Payer: Medicare Other | Source: Ambulatory Visit | Attending: Neurology | Admitting: Neurology

## 2012-03-07 ENCOUNTER — Other Ambulatory Visit: Payer: Medicare Other

## 2012-03-07 DIAGNOSIS — G459 Transient cerebral ischemic attack, unspecified: Secondary | ICD-10-CM

## 2012-03-07 DIAGNOSIS — Z9181 History of falling: Secondary | ICD-10-CM

## 2012-03-07 DIAGNOSIS — R269 Unspecified abnormalities of gait and mobility: Secondary | ICD-10-CM

## 2012-03-14 ENCOUNTER — Encounter: Payer: Self-pay | Admitting: Neurosurgery

## 2012-03-15 ENCOUNTER — Encounter: Payer: Self-pay | Admitting: Neurosurgery

## 2012-03-15 ENCOUNTER — Ambulatory Visit (INDEPENDENT_AMBULATORY_CARE_PROVIDER_SITE_OTHER): Payer: Medicare Other | Admitting: Vascular Surgery

## 2012-03-15 ENCOUNTER — Ambulatory Visit (INDEPENDENT_AMBULATORY_CARE_PROVIDER_SITE_OTHER): Payer: Medicare Other | Admitting: Neurosurgery

## 2012-03-15 VITALS — BP 144/97 | HR 95 | Resp 16 | Ht 70.0 in | Wt 183.0 lb

## 2012-03-15 DIAGNOSIS — I714 Abdominal aortic aneurysm, without rupture, unspecified: Secondary | ICD-10-CM | POA: Insufficient documentation

## 2012-03-15 DIAGNOSIS — I719 Aortic aneurysm of unspecified site, without rupture: Secondary | ICD-10-CM

## 2012-03-15 NOTE — Progress Notes (Signed)
[facility] [place] AAA/Carotid Office Note  CC: AAA surveillance Referring Physician: Edilia Bo  History of Present Illness: 79 year old male patient of Dr. Edilia Bo followed for known AAA. The patient denies any unusual abdominal or back pain, new medical diagnoses or recent surgery.  Past Medical History  Diagnosis Date  . Prostate cancer     with radiation  . Bipolar 1 disorder   . Coronary artery disease   . Atrial fibrillation     persistent, with atypical atrial flutter  . Tachycardia-bradycardia     s/p PPM by Dr Reyes Ivan, Gen change by Fawn Kirk 3/12  . Parkinson's disease   . Hypertension   . AAA (abdominal aortic aneurysm)     medical management  . Dementia     mild  . DVT (deep venous thrombosis)     ROS: [x]  Positive   [ ]  Denies    General: [ ]  Weight loss, [ ]  Fever, [ ]  chills Neurologic: [ ]  Dizziness, [ ]  Blackouts, [ ]  Seizure [ ]  Stroke, [ ]  "Mini stroke", [ ]  Slurred speech, [ ]  Temporary blindness; [ ]  weakness in arms or legs, [ ]  Hoarseness Cardiac: [ ]  Chest pain/pressure, [ ]  Shortness of breath at rest [ ]  Shortness of breath with exertion, [ ]  Atrial fibrillation or irregular heartbeat Vascular: [ ]  Pain in legs with walking, [ ]  Pain in legs at rest, [ ]  Pain in legs at night,  [ ]  Non-healing ulcer, [ ]  Blood clot in vein/DVT,   Pulmonary: [ ]  Home oxygen, [ ]  Productive cough, [ ]  Coughing up blood, [ ]  Asthma,  [ ]  Wheezing Musculoskeletal:  [ ]  Arthritis, [ ]  Low back pain, [ ]  Joint pain Hematologic: [ ]  Easy Bruising, [ ]  Anemia; [ ]  Hepatitis Gastrointestinal: [ ]  Blood in stool, [ ]  Gastroesophageal Reflux/heartburn, [ ]  Trouble swallowing Urinary: [ ]  chronic Kidney disease, [ ]  on HD - [ ]  MWF or [ ]  TTHS, [ ]  Burning with urination, [ ]  Difficulty urinating Skin: [ ]  Rashes, [ ]  Wounds Psychological: [ ]  Anxiety, [ ]  Depression   Social History History  Substance Use Topics  . Smoking status: Never Smoker   . Smokeless  tobacco: Never Used  . Alcohol Use: No    Family History Family History  Problem Relation Age of Onset  . Heart attack Mother   . Heart disease Father   . Heart disease Brother     Heart Disease before age 58  . Heart attack Brother     Allergies  Allergen Reactions  . Penicillins Rash    Current Outpatient Prescriptions  Medication Sig Dispense Refill  . aspirin 81 MG tablet Take 81 mg by mouth daily.        . carbidopa-levodopa (SINEMET) 25-250 MG per tablet Take 1 tablet by mouth 4 (four) times daily.        . Cholecalciferol (VITAMIN D PO) Take 50,000 Units by mouth once a week.       . ezetimibe-simvastatin (VYTORIN) 10-20 MG per tablet Take 1 tablet by mouth daily.       Marland Kitchen gabapentin (NEURONTIN) 300 MG capsule Take 300 mg by mouth 4 (four) times daily.        . hydrOXYzine (VISTARIL) 25 MG capsule Take 50 mg by mouth at bedtime.       Marland Kitchen lithium 300 MG capsule Take 300 mg by mouth 1 day or 1 dose.        Marland Kitchen  metoprolol tartrate (LOPRESSOR) 25 MG tablet Take 25 mg by mouth 2 (two) times daily.        . multivitamin (THERAGRAN) per tablet Take 1 tablet by mouth daily.        . Oxcarbazepine (TRILEPTAL) 300 MG tablet Take 300 mg by mouth 4 (four) times daily.       . rivastigmine (EXELON) 4.6 mg/24hr Place 1 patch onto the skin daily.        Marland Kitchen warfarin (COUMADIN) 5 MG tablet TAKE ONE TABLET BY MOUTH AS DIRECTED  40 tablet  3   No current facility-administered medications for this visit.    Physical Examination  Filed Vitals:   03/15/12 0950  BP: 144/97  Pulse: 95  Resp: 16    Body mass index is 26.26 kg/(m^2).  General:  WDWN in NAD Gait: Normal HEENT: WNL Eyes: Pupils equal Pulmonary: normal non-labored breathing , without Rales, rhonchi,  wheezing Cardiac: RRR, without  Murmurs, rubs or gallops; Abdomen: soft, NT, no masses Skin: no rashes, ulcers noted  Vascular Exam Pulses: 3+ radial pulses bilaterally, no abdominal pulsatile mass is palpated Carotid  bruits: Carotid pulses to auscultation no bruits are heard Extremities without ischemic changes, no Gangrene , no cellulitis; no open wounds;  Musculoskeletal: no muscle wasting or atrophy   Neurologic: A&O X 3; Appropriate Affect ; SENSATION: normal; MOTOR FUNCTION:  moving all extremities equally. Speech is fluent/normal  Non-Invasive Vascular Imaging AAA duplex shows a maximum diameter of 3.1 x 3.4 which is consistent with previous exam in July 2012.  ASSESSMENT/PLAN: Asymptomatic patient will followup in one year with repeat AAA duplex. The patient's questions were encouraged and answered, he is in agreement with this plan.  Lauree Chandler ANP   Clinic MD: Early

## 2012-03-15 NOTE — Progress Notes (Signed)
Abdominal Aorta Duplex performed @ [facility] 03/15/2012

## 2012-03-16 NOTE — Addendum Note (Signed)
Addended by: Sharee Pimple on: 03/16/2012 10:23 AM   Modules accepted: Orders

## 2012-03-18 LAB — REMOTE PACEMAKER DEVICE
ATRIAL PACING PM: 41
BRDY-0002RV: 60 {beats}/min
BRDY-0004RV: 130 {beats}/min
RV LEAD AMPLITUDE: 16 mv
RV LEAD IMPEDENCE PM: 426 Ohm
VENTRICULAR PACING PM: 31

## 2012-03-22 ENCOUNTER — Encounter: Payer: Self-pay | Admitting: *Deleted

## 2012-04-12 ENCOUNTER — Other Ambulatory Visit: Payer: Medicare Other

## 2012-04-12 ENCOUNTER — Ambulatory Visit: Payer: Medicare Other | Admitting: Cardiology

## 2012-04-22 ENCOUNTER — Telehealth: Payer: Self-pay | Admitting: Neurology

## 2012-04-22 NOTE — Telephone Encounter (Signed)
Kissa: Pls call pt or wife to let them know, that the recent sleep study did not show any significant sleep apnea. He was noted to be restless and was noted to mumble in sleep but no dream enactments were seen. No new steps to take at this time, keep FU with me as scheduled, thx s

## 2012-04-25 ENCOUNTER — Ambulatory Visit: Payer: Medicare Other | Admitting: Neurology

## 2012-04-29 ENCOUNTER — Encounter: Payer: Self-pay | Admitting: Neurology

## 2012-04-30 ENCOUNTER — Telehealth: Payer: Self-pay | Admitting: *Deleted

## 2012-04-30 NOTE — Telephone Encounter (Signed)
Pt's wife left vm requesting recent sleep study results.

## 2012-05-02 ENCOUNTER — Encounter: Payer: Self-pay | Admitting: Neurology

## 2012-05-02 ENCOUNTER — Ambulatory Visit (INDEPENDENT_AMBULATORY_CARE_PROVIDER_SITE_OTHER): Payer: Medicare Other | Admitting: Neurology

## 2012-05-02 VITALS — BP 122/80 | HR 84 | Temp 97.4°F | Resp 20 | Wt 182.0 lb

## 2012-05-02 DIAGNOSIS — G251 Drug-induced tremor: Secondary | ICD-10-CM

## 2012-05-02 DIAGNOSIS — G250 Essential tremor: Secondary | ICD-10-CM

## 2012-05-02 DIAGNOSIS — T43591A Poisoning by other antipsychotics and neuroleptics, accidental (unintentional), initial encounter: Secondary | ICD-10-CM

## 2012-05-02 DIAGNOSIS — F0390 Unspecified dementia without behavioral disturbance: Secondary | ICD-10-CM

## 2012-05-02 DIAGNOSIS — G20 Parkinson's disease: Secondary | ICD-10-CM | POA: Insufficient documentation

## 2012-05-02 DIAGNOSIS — G20A1 Parkinson's disease without dyskinesia, without mention of fluctuations: Secondary | ICD-10-CM

## 2012-05-02 DIAGNOSIS — F319 Bipolar disorder, unspecified: Secondary | ICD-10-CM

## 2012-05-02 MED ORDER — RIVASTIGMINE 13.3 MG/24HR TD PT24: 1.0000 | MEDICATED_PATCH | TRANSDERMAL | Status: DC

## 2012-05-02 NOTE — Telephone Encounter (Signed)
Please tell patient that his sleep study did not show any significant obstructive sleep apnea but he really did not sleep well. I would like to go over details during an appointment. Please schedule patient if he is not already scheduled for the next available appointment, thank you. Huston Foley, MD, PhD [facility])

## 2012-05-02 NOTE — Patient Instructions (Addendum)
1.  We will refer you to the  Parkinsons program for therapy 2.  Check out the [facility] ([facility]) 3.  Take your carbidopa/levodopa 4 times per day every day (8am, 11am, 2 pm, 5pm) 4.  Exercise and get back to the [facility] at least 3 times per week 5.  Continue with the exelon patches

## 2012-05-02 NOTE — Progress Notes (Signed)
Jacob Boyd was seen today in the movement disorders clinic for neurologic consultation at the request of Kari Baars, MD.  The consultation is for the evaluation of PD and related dementia.  The patient is a prior pt of Dr. Sandria Manly.  The patients wife states he was dx with ET when the patient was about 79 years old.  He was not put on medication for this initially, but was later put on inderal and had SE with that.  They are unusure of when the dx of PD came in but it has been over 10 years. They do not know the first symptom. They do not know when levodopa was added and are unsure if it is helpful.  He is on carbidopa/levodopa 25/250 three to four times per day but is very noncompliant.  He administers his own medication.  He initially told me that he takes his levodopa this morning, but it turns out he has not yet taken it today.  He has been on lithium since the 1960's.  He is on it for bipolar d/o.  His wife relates that he was told by Dr. Sandria Manly that his parkinsonism is due to lithium, but the psychiatrist told him that it was not.  He has been on the atypical antipsychotic medications in the past, but has been off of those for many years.   He has not been to PT for PD for 2 years.  He is no longer exercising but was exercising faithfully as a part of [employer] at the [employer] [facility].   Specific Symptoms:  Tremor: yes, bilateral upper extremities and he has what sounds like myoclonus Voice: decreased Sleep: sleeps poorly and has EDS.  Went to have PSG but could not tolerate the test.    Vivid Dreams:  yes  Acting out dreams:  yes  (sleep talking and eats at night, but not asleep) Wet Pillows: no Postural symptoms:  yes  Falls?  yes, taken off of coumadin for falls.  Seems to fall in clusters.  Will go weeks without a fall and then may have several.  Has grab bars in house. Bradykinesia symptoms: difficulty with initiating movement and slow and small handwriting Loss of smell:  no Loss of  taste:  no Urinary Incontinence:  yes, wears depends at night only Difficulty Swallowing:  no Handwriting, micrographia: yes Trouble with ADL's:  no  Trouble buttoning clothing: yes Depression:  no, last acute episode in the hospital Memory changes:  yes, short term memory loss, not driven for several years (stopped due to diplopia few years ago) Hallucinations:  no  visual distortions: no N/V:  no Lightheaded:  yes (occasionally)  Syncope: no Diplopia:  yes, he is unsure if it is horizontal or vertical.  This started well after the diagnosis of Parkinson's disease.   Dyskinesia:  no   PREVIOUS MEDICATIONS: Seroquel, risperdol  ALLERGIES:   Allergies  Allergen Reactions  . Penicillins Rash    CURRENT MEDICATIONS:  Current Outpatient Prescriptions on File Prior to Visit  Medication Sig Dispense Refill  . aspirin 81 MG tablet Take 81 mg by mouth daily.        . carbidopa-levodopa (SINEMET) 25-250 MG per tablet Take 1 tablet by mouth 4 (four) times daily.        . Cholecalciferol (VITAMIN D PO) Take 50,000 Units by mouth once a week.       . ezetimibe-simvastatin (VYTORIN) 10-20 MG per tablet Take 1 tablet by mouth daily.       Marland Kitchen  gabapentin (NEURONTIN) 300 MG capsule Take 300 mg by mouth 4 (four) times daily.        . hydrOXYzine (VISTARIL) 25 MG capsule Take 50 mg by mouth at bedtime.       Marland Kitchen lithium 300 MG capsule Take 300 mg by mouth 1 day or 1 dose.        . metoprolol tartrate (LOPRESSOR) 25 MG tablet Take 25 mg by mouth 2 (two) times daily.        . Oxcarbazepine (TRILEPTAL) 300 MG tablet Take 300 mg by mouth 4 (four) times daily.        No current facility-administered medications on file prior to visit.    PAST MEDICAL HISTORY:   Past Medical History  Diagnosis Date  . Prostate cancer     with radiation  . Bipolar 1 disorder   . Coronary artery disease   . Atrial fibrillation     persistent, with atypical atrial flutter  . Tachycardia-bradycardia     s/p PPM by  Dr Reyes Ivan, Gen change by Fawn Kirk 3/12  . Parkinson's disease   . Hypertension   . AAA (abdominal aortic aneurysm)     medical management  . Dementia     mild  . DVT (deep venous thrombosis)     PAST SURGICAL HISTORY:   Past Surgical History  Procedure Laterality Date  . Nasal sinus surgery    . Pacemaker insertion  04/08/2010    Initial pacemaker by Dr Reyes Ivan, gen change by Fawn Kirk (MDT) 3/12  . Cataract extraction w/ intraocular lens  implant, bilateral      SOCIAL HISTORY:   History   Social History  . Marital Status: Married    Spouse Name: N/A    Number of Children: N/A  . Years of Education: N/A   Occupational History  . Not on file.   Social History Main Topics  . Smoking status: Never Smoker   . Smokeless tobacco: Never Used  . Alcohol Use: No  . Drug Use: No  . Sexually Active: Not on file   Other Topics Concern  . Not on file   Social History Narrative  . No narrative on file    FAMILY HISTORY:   Family Status  Relation Status Death Age  . Mother Deceased 54    old age  . Father Deceased 51    Heart disease  . Sister Alive     dementia  . Brother Deceased     4, MI, MVA, ?NPH, trauma  . Child Alive   . Child Alive   . Child Alive     ROS:  A complete 10 system review of systems was obtained and was unremarkable apart from what is mentioned above.  PHYSICAL EXAMINATION:    VITALS:   Filed Vitals:   05/02/12 1045  BP: 122/80  Pulse: 84  Temp: 97.4 F (36.3 C)  Resp: 20  Weight: 182 lb (82.555 kg)    GEN:  The patient appears stated age and is in NAD. HEENT:  Normocephalic, atraumatic.  The mucous membranes are moist. The superficial temporal arteries are without ropiness or tenderness. CV:  RRR Lungs:  CTAB Neck/HEME:  There are no carotid bruits bilaterally.  Neurological examination:  Orientation: The patient scored a 14/30 on his MoCA. Cranial nerves: There is good facial symmetry. Pupils are equal round and reactive to light  bilaterally. Fundoscopic exam reveals clear margins bilaterally. Extraocular muscles are intact. The visual fields are full to confrontational testing.  The speech is fluent and clear. Soft palate rises symmetrically and there is no tongue deviation. Hearing is decreased to conversational tone. Sensation: Sensation is intact to light and pinprick throughout (facial, trunk, extremities). Vibration is intact at the bilateral big toe. There is no extinction with double simultaneous stimulation. There is no sensory dermatomal level identified. Motor: Strength is 5/5 in the bilateral upper and lower extremities.   Shoulder shrug is equal and symmetric.  There is no pronator drift. Deep tendon reflexes: Deep tendon reflexes are 3/4 at the bilateral biceps, triceps, brachioradialis, patella and absent at the bilateral achilles. Plantar responses are downgoing bilaterally.  Movement examination: Tone: There is increased tone in the bilateral upper extremities.  The tone in the lower extremities is normal.  Abnormal movements: There is both a resting and postural tremor Coordination:  There is definite decremation with RAM's, seen more prominently in the left hemibody than the right, but seen with all forms including alternating supination and pronation of the forearm, hand opening and closing, finger taps, heel taps and toe taps. Gait and Station: The patient has some difficulty arising out of a deep-seated chair without the use of the hands but he is able to do this on trial #2. The patient's stride length is decreased with a festinating gait.  He has mild camptocormia to the left.  The patient has a positive pull test.      ASSESSMENT/PLAN:  1.  Parkinsonism.  I suspect that this does represent idiopathic Parkinson's disease.  I reassured them that lithium is not the cause of the parkinsonism, but it certainly is contributing to tremor.  We talked about the fact that lithium does not cause parkinsonism,  although some of the other atypical antipsychotic that he has been on certainly could.  Nonetheless, tardive parkinsonism is extremely rare and I do not suspect this.  I suspect that when he was diagnosed with essential tremor years ago, that he had a lithium-induced tremor and not essential tremor.  -I suspect that gradual worsening is due to noncompliance with medication.  I was going to change him to the carbidopa/levodopa 25/100 formulation, but they have a lot of the carbidopa/levodopa 25/250 at home somewhat like to use this up.  The most important part of this is compliant with the medication, and I asked his wife to give him the medication (all his medication) and monitor how he does.  He will take this 4 times per day.  Risks, benefits, side effects and alternative therapies were discussed.  The opportunity to ask questions was given and they were answered to the best of my ability.  The patient expressed understanding and willingness to follow the outlined treatment protocols.  -I am going to refer him for the Parkinson's program [facility].  -He and I talked about the importance of exercise.  Patient education was provided.  I recommended that he restart going to the [facility].  -I filled out his paperwork for his long term care. 2.  Memory loss, likely related to parkinsonism.  - he will continue with Exelon patch, 13.3 mg.  -We talked about the importance of both physical and mental exercises relates to memory. 3.  Bipolar disorder.  -He is under the care of a psychiatrist.  He has failed and had significant side effects to the atypical antipsychotics, particularly Seroquel. 4  I will plan on seeing the patient back in 8 weeks, sooner should new neurologic issues arise.  Start time of  visit:  10:45 AM, end time of visit: 12:15 PM

## 2012-05-05 NOTE — Telephone Encounter (Signed)
Spoke with patient's wife and relayed the findings of the sleep study.

## 2012-05-06 ENCOUNTER — Ambulatory Visit: Payer: Medicare Other | Attending: Neurology

## 2012-05-06 DIAGNOSIS — R471 Dysarthria and anarthria: Secondary | ICD-10-CM | POA: Insufficient documentation

## 2012-05-06 DIAGNOSIS — R269 Unspecified abnormalities of gait and mobility: Secondary | ICD-10-CM | POA: Insufficient documentation

## 2012-05-06 DIAGNOSIS — R279 Unspecified lack of coordination: Secondary | ICD-10-CM | POA: Insufficient documentation

## 2012-05-06 DIAGNOSIS — Z5189 Encounter for other specified aftercare: Secondary | ICD-10-CM | POA: Insufficient documentation

## 2012-05-06 DIAGNOSIS — G20 Parkinson's disease: Secondary | ICD-10-CM | POA: Insufficient documentation

## 2012-05-06 DIAGNOSIS — R4189 Other symptoms and signs involving cognitive functions and awareness: Secondary | ICD-10-CM | POA: Insufficient documentation

## 2012-05-06 DIAGNOSIS — G20A1 Parkinson's disease without dyskinesia, without mention of fluctuations: Secondary | ICD-10-CM | POA: Insufficient documentation

## 2012-05-06 DIAGNOSIS — M2420 Disorder of ligament, unspecified site: Secondary | ICD-10-CM | POA: Insufficient documentation

## 2012-05-06 DIAGNOSIS — M629 Disorder of muscle, unspecified: Secondary | ICD-10-CM | POA: Insufficient documentation

## 2012-05-09 ENCOUNTER — Ambulatory Visit: Payer: Medicare Other

## 2012-05-09 ENCOUNTER — Ambulatory Visit: Payer: Medicare Other | Admitting: Physical Therapy

## 2012-05-09 ENCOUNTER — Ambulatory Visit: Payer: Medicare Other | Admitting: Occupational Therapy

## 2012-05-10 ENCOUNTER — Ambulatory Visit (INDEPENDENT_AMBULATORY_CARE_PROVIDER_SITE_OTHER): Payer: Medicare Other | Admitting: Neurology

## 2012-05-10 ENCOUNTER — Encounter: Payer: Self-pay | Admitting: Neurology

## 2012-05-10 VITALS — BP 122/80 | HR 92 | Temp 98.0°F | Ht 68.5 in | Wt 182.0 lb

## 2012-05-10 DIAGNOSIS — G251 Drug-induced tremor: Secondary | ICD-10-CM

## 2012-05-10 DIAGNOSIS — R0902 Hypoxemia: Secondary | ICD-10-CM

## 2012-05-10 DIAGNOSIS — G20 Parkinson's disease: Secondary | ICD-10-CM

## 2012-05-10 DIAGNOSIS — T43591A Poisoning by other antipsychotics and neuroleptics, accidental (unintentional), initial encounter: Secondary | ICD-10-CM

## 2012-05-10 DIAGNOSIS — F319 Bipolar disorder, unspecified: Secondary | ICD-10-CM

## 2012-05-10 DIAGNOSIS — G479 Sleep disorder, unspecified: Secondary | ICD-10-CM

## 2012-05-10 DIAGNOSIS — G250 Essential tremor: Secondary | ICD-10-CM

## 2012-05-10 HISTORY — DX: Sleep disorder, unspecified: G47.9

## 2012-05-10 HISTORY — DX: Hypoxemia: R09.02

## 2012-05-10 NOTE — Progress Notes (Signed)
Subjective:    Patient ID: Jacob Boyd is a 79 y.o. male.  HPI Interim history:  Jacob Boyd is an 79 year old gentleman who presents for followup consultation after a recent sleep study. He is accompanied by his wife again today. I first met him and his wife on 03/04/2012 at which time I suggested reevaluation of his obstructive sleep apnea with another sleep study. Had a UPPP for sleep apnea treatment in the past. He had a sleep study on 04/01/2011 and explained the test results to him and his wife in detail today. Sleep efficiency was markedly reduced at 44.8% with a latency to sleep of 0 minutes. Wake after sleep onset was highly elevated at 216.5 minutes with severe sleep fragmentation noted. He had a high arousal index of 39 per hour due primarily to spontaneous arousals. He had a increased percentage of stage I and 2 sleep at 35.3 and 64.1% respectively, near absence of slow wave sleep and absence of REM sleep. He had rare mild snoring. There were no significant period leg movements of sleep. Overall his AHI was 1 per hour. He had an oxyhemoglobin desaturation nadir of 84% with a baseline of 94%. He did have some EKG changes consistent with atrial fibrillation which is not new.  He is a retired Therapist, sports with a Hx of BPAD, diagnosed in his late 65s - on lithium for over 40 years. 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 He also was Dx with ET years ago by Jacob Boyd at [facility] at the time. Sinemet is still helpful. He is on 25/250 mg qid, but does not always take it qid, mostly bid to tid. His balance is impaired and he has had falls. He has A fib Hx and has a PM since 2006 and he recently had a CTH (given Hx of falls) on 03/07/12: there was mild peri-rolandic and perisylvian atrophy, mild-moderate chronic small vessel ischemic disease and overall no significant change from CT on 03/02/05 was reported. He was diagnosed with dementia years ago. He had a bad  experience with a psychyatric medication and since then, around 2000 he has had progressive decline in his cognitive function. Coumadin for AF has been discontinued because of fall risk. He has bizarre dreams at night and fell out of bed once in the past. He has vivid dreams almost nightly and sometimes even in his naps. He has dream enactments often, but has not injured himself or his wife; he did kick her once. He has RLS and he kicks in his sleep. He has been switched from Aricept to exelon patch maybe a year ago. He has hearing aids, but not always in place. On 01/05/11: MMSE 22/30, but last week during his latest visit with Jacob Boyd scored only 18/30. He has melanoma of  the face  with frequent excisions. He had left C5 shingles. He has double vision of horizontal type. He had Doppler study of the carotids and 2-D echocardiogram which were unremarkable. On 07/06/11: MMSE 23/30. He refuses to use a walker or use a cane in the house and may use a walker outside, but has it in the car today. He has not fallen in about 2 months. He had an episode of confusion 11/2011 evaluated by Jacob Boyd with 2-D echocardiogram and Doppler study of the carotids. He is excessively sleepy during the day and snores at night, but not nearly as bad as in the past. He was diagnosed with OSA with  a home sleep test in the 90s and is s/p UPPP. On 02/26/2012: MMSE 18/30, CDT 2/4, Geriatric depression scale 3/15 and Falls assessment tool score was 17.  He reports no new problems and his wife reports that his sleep is very bad and he is sleepy during the day. He recently saw Jacob Boyd on 05/02/12 and she recommended neuro-rehab, which he recently started and is scheduled out into May. He has tried [place], Trazodone and OTC melatonin. They quit working. He has had no recent illness. His wife provides most of the history. He does answer questions appropriately but only with short answers. He is very hard of hearing despite his hearing  aids.  His Past Medical History Is Significant For: Past Medical History  Diagnosis Date  . Prostate cancer     with radiation  . Bipolar 1 disorder   . Coronary artery disease   . Atrial fibrillation     persistent, with atypical atrial flutter  . Tachycardia-bradycardia     s/p PPM by Dr Jacob Boyd, Gen change by Jacob Boyd 3/12  . Parkinson's disease   . Hypertension   . AAA (abdominal aortic aneurysm)     medical management  . Dementia     mild  . DVT (deep venous thrombosis)   . Hypoxemia 05/10/2012  . Sleep disorder 05/10/2012   His Past Surgical History Is Significant For: Past Surgical History  Procedure Laterality Date  . Nasal sinus surgery    . Pacemaker insertion  04/08/2010    Initial pacemaker by Dr Jacob Boyd, gen change by Jacob Boyd (Jacob Boyd) 3/12  . Cataract extraction w/ intraocular lens  implant, bilateral     His Family History Is Significant For: Family History  Problem Relation Age of Onset  . Heart attack Mother   . Heart disease Father   . Heart disease Brother     Heart Disease before age 73  . Heart attack Brother    His Social History Is Significant For: History   Social History  . Marital Status: Married    Spouse Name: N/A    Number of Children: N/A  . Years of Education: N/A   Social History Main Topics  . Smoking status: Never Smoker   . Smokeless tobacco: Never Used  . Alcohol Use: No  . Drug Use: No  . Sexually Active: Not on file   Other Topics Concern  . Not on file   Social History Narrative  . No narrative on file    His Allergies Are:  Allergies  Allergen Reactions  . Penicillins Rash  :   His Current Medications Are:  Outpatient Encounter Prescriptions as of 05/10/2012  Medication Sig Dispense Refill  . aspirin 81 MG tablet Take 81 mg by mouth daily.        . carbidopa-levodopa (SINEMET) 25-250 MG per tablet Take 1 tablet by mouth 4 (four) times daily.        . Cholecalciferol (VITAMIN D PO) Take 50,000 Units by mouth once a week.       .  cholecalciferol (VITAMIN D) 1000 UNITS tablet Take 1,000 Units by mouth daily.      Marland Kitchen ezetimibe-simvastatin (VYTORIN) 10-20 MG per tablet Take 1 tablet by mouth daily.       Marland Kitchen gabapentin (NEURONTIN) 300 MG capsule Take 300 mg by mouth 4 (four) times daily.        . hydrOXYzine (VISTARIL) 25 MG capsule Take 50 mg by mouth at bedtime.       Marland Kitchen  lithium 300 MG capsule Take 300 mg by mouth 1 day or 1 dose.        . metoprolol tartrate (LOPRESSOR) 25 MG tablet Take 25 mg by mouth 2 (two) times daily.        . Oxcarbazepine (TRILEPTAL) 300 MG tablet Take 300 mg by mouth 4 (four) times daily.       . Rivastigmine 13.3 MG/24HR PT24 Place 1 patch (13.3 mg total) onto the skin 1 day or 1 dose.  30 patch  5   No facility-administered encounter medications on file as of 05/10/2012.  :  Review of Systems  Constitutional: Positive for fatigue.  HENT: Positive for hearing loss.        Snoring  Neurological: Positive for tremors.       Memory loss, restless leg  Psychiatric/Behavioral: Positive for confusion and dysphoric mood.    Objective:  Neurologic Exam  Physical Exam Physical Examination:   Filed Vitals:   05/10/12 0903  BP: 122/80  Pulse: 92  Temp: 98 F (36.7 C)    General Examination: The patient is a very pleasant 79 y.o. male in no acute distress.  He is hard of hearing. HEENT exam: Normocephalic, atraumatic. He is hard of hearing without hearing aids in place. Nuchal tone is moderately increased. He has no lip, or neck or jaw tremor. His speech is very slightly dysarthric and mild to moderately hypophonic. Extraocular tracking is mildly impaired. Pupils are reactive to light. Face is symmetric with mild decrease in facial animation noted. Speech is mildly hypophonic. He has no carotid bruits. Airway examination reveals status post UPPP. There is some thickening of the soft palate which appears to be mildly redundant. Tongue is normal in size. Airway entry is mildly tight. Chest is clear  to auscultation without wheezing or rhonchi. Heart sounds are normal but irregularly irregular and no murmurs, rubs or gallops are audible. Abdomen is soft, nontender with normal bowel sounds appreciated. He has no pitting edema in the distal lower extremities bilaterally. Skin is warm and dry. Mucosal membranes are mildly dry appearing. Neurologically: Mental status: The patient is awake, alert and oriented to self and circumstance. I did not repeat his Mini-Mental state exam as he just had it recently. We will follow this periodically. Cranial nerves are as described under HEENT exam. Motor exam reveals increased tone in the upper extremities bilaterally with cogwheeling noted. He does not have much in the way of resting tremor. He does have an intermittent resting tremor in the left upper extremity. He has a mild postural tremor in both upper extremities and no action tremor. Finger taps and fine motor skills are moderately impaired, left worse than right. Foot agility is moderately impaired again, worse on the left. He needs no assistance standing up from the seated position but requires 3 attempts and his posture is moderately stooped. He walks without a cane or assistance but is does have a shuffling gait with decreased arm swing bilaterally. He turns insecurely. His balance is impaired. Sensory exam is unchanged. Romberg is negative. Cerebellar testing shows no ataxia or dysmetria on FTN.   Assessment and Plan:   In summary, Mr. Mcfayden  is a 79 year old gentleman with parkinsonism, possibly left-sided predominant Parkinson's disease with memory loss. He also has evidence of peripheral neuropathy, restless legs syndrome with no PLMs on PSG recently and a history suggestive of RBD in the past. He also has a prior Dx of OSA, but the sleep study recently did not  demonstrate any obstructive sleep disordered breathing. Nevertheless he did have some decrease in oxygen saturation to a nadir of 84%. He does  not have a known history of asbestos exposure or recurrent pneumonias or asthma or any other chronic lung disease. Nevertheless, would like to send him for pulmonary evaluation. The patient and his wife are in agreement. His sleep study demonstrated poor sleep consolidation with a high arousal index and mostly light stage sleep. He has tried multiple sleep aids in the past. I would like for him to take a drug holiday from the Vistaril and try melatonin 3 or 5 mg to up to 7.5-10 mg if needed gradually. He has an appointment with Jacob Boyd in June and I would like for him to keep that as well as continue with PT, OT and ST as recommended by Jacob Boyd. I will see him back for sleep related issues in 6 months, sooner if the need arises and they are encouraged to call with any interim questions, concerns, problems, updates a refill request. Of note he was on clonazepam in the past for RBD but more recently he has not had much in the way of dream enactments.

## 2012-05-10 NOTE — Patient Instructions (Addendum)
I would like for you to try melatonin again: start with 3 mg, then after a couple of nights go up to 5 mg and you may go up further to even 7.5 to 10 mg if needed. I would like for you to try this for 1-2 weeks and then you can go back to Vistaril. it may help to take a "drug holiday" from the vistaril from time to time. Keep your follow up with Dr. Arbutus Leas and Follow up with me for in 6 months.   Remember to drink plenty of fluid, eat healthy meals and do not skip any meals. Try to eat protein with a every meal and eat a healthy snack such as fruit or nuts in between meals. Try to keep a regular sleep-wake schedule and try to exercise daily, particularly in the form of walking, 20-30 minutes a day, if you can.   Engage in social activities in your community and with your family and try to keep up with current events by reading the newspaper or watching the news.   As far as your medications are concerned, I would like to suggest the above and at this point no other changes.   Please also call us for any test results so we can go over those with you on the phone. Brett Canales is my clinical assistant and will answer any of your questions and relay your messages to me and also relay most of my messages to you.  Our phone number is [phone]. We also have an after hours call service for urgent matters and there is a physician on-call for urgent questions. For any emergencies you know to call 911 or go to the nearest emergency room.

## 2012-05-12 ENCOUNTER — Ambulatory Visit: Payer: Medicare Other | Admitting: Occupational Therapy

## 2012-05-18 ENCOUNTER — Ambulatory Visit: Payer: Medicare Other

## 2012-05-19 ENCOUNTER — Ambulatory Visit: Payer: Medicare Other | Admitting: Occupational Therapy

## 2012-05-19 ENCOUNTER — Ambulatory Visit: Payer: Medicare Other

## 2012-05-23 ENCOUNTER — Ambulatory Visit: Payer: Medicare Other | Admitting: Occupational Therapy

## 2012-05-23 ENCOUNTER — Encounter: Payer: Self-pay | Admitting: Physical Therapy

## 2012-05-23 ENCOUNTER — Ambulatory Visit: Payer: Medicare Other | Admitting: Physical Therapy

## 2012-05-25 ENCOUNTER — Ambulatory Visit: Payer: Medicare Other | Admitting: Occupational Therapy

## 2012-05-25 ENCOUNTER — Ambulatory Visit: Payer: Medicare Other | Admitting: Physical Therapy

## 2012-05-25 ENCOUNTER — Telehealth: Payer: Self-pay | Admitting: Neurology

## 2012-05-25 DIAGNOSIS — G20A1 Parkinson's disease without dyskinesia, without mention of fluctuations: Secondary | ICD-10-CM

## 2012-05-25 DIAGNOSIS — G20 Parkinson's disease: Secondary | ICD-10-CM

## 2012-05-25 NOTE — Telephone Encounter (Signed)
Pt's wife would like order for a transport chair and home OT/PT/ST.  His legs have gotten very weak and she is unable to get him out the house to take him to rehab.

## 2012-05-26 MED ORDER — TRANSPORT CHAIR MISC: 1.0000 | Freq: Once | Status: DC

## 2012-05-26 NOTE — Telephone Encounter (Signed)
Orders placed will notify pt's wife.

## 2012-05-26 NOTE — Telephone Encounter (Signed)
You may give orders for those things but make sure that he is actually taking the levodopa (has had trouble with compliance).  If at all possible, the outpt rehab is going to be better than home health rehab.

## 2012-05-30 ENCOUNTER — Encounter: Payer: Medicare Other | Admitting: *Deleted

## 2012-06-01 ENCOUNTER — Encounter: Payer: Medicare Other | Admitting: Occupational Therapy

## 2012-06-01 ENCOUNTER — Ambulatory Visit: Payer: Medicare Other | Admitting: Physical Therapy

## 2012-06-03 ENCOUNTER — Ambulatory Visit: Payer: Medicare Other | Admitting: Physical Therapy

## 2012-06-03 ENCOUNTER — Ambulatory Visit (INDEPENDENT_AMBULATORY_CARE_PROVIDER_SITE_OTHER): Payer: Medicare Other | Admitting: *Deleted

## 2012-06-03 DIAGNOSIS — Z950 Presence of cardiac pacemaker: Secondary | ICD-10-CM

## 2012-06-03 DIAGNOSIS — I495 Sick sinus syndrome: Secondary | ICD-10-CM

## 2012-06-04 ENCOUNTER — Encounter: Payer: Self-pay | Admitting: Internal Medicine

## 2012-06-06 ENCOUNTER — Ambulatory Visit: Payer: Medicare Other | Admitting: Physical Therapy

## 2012-06-06 ENCOUNTER — Encounter: Payer: Medicare Other | Admitting: Occupational Therapy

## 2012-06-08 ENCOUNTER — Ambulatory Visit (INDEPENDENT_AMBULATORY_CARE_PROVIDER_SITE_OTHER): Payer: Medicare Other | Admitting: Cardiology

## 2012-06-08 ENCOUNTER — Ambulatory Visit: Payer: Medicare Other | Admitting: Physical Therapy

## 2012-06-08 ENCOUNTER — Encounter: Payer: Medicare Other | Admitting: Occupational Therapy

## 2012-06-08 ENCOUNTER — Encounter: Payer: Self-pay | Admitting: Cardiology

## 2012-06-08 VITALS — BP 110/66 | HR 79 | Ht 68.5 in

## 2012-06-08 DIAGNOSIS — I4891 Unspecified atrial fibrillation: Secondary | ICD-10-CM

## 2012-06-08 DIAGNOSIS — G4730 Sleep apnea, unspecified: Secondary | ICD-10-CM

## 2012-06-08 DIAGNOSIS — E7800 Pure hypercholesterolemia, unspecified: Secondary | ICD-10-CM

## 2012-06-08 NOTE — Progress Notes (Signed)
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 He has a past history of tachybradycardia syndrome and underlying atrial fibrillation. He has a history of hypercholesterolemia. He is also had Parkinson's disease and depression. We saw him as a work in on 04/10/11 after he had a spell while sitting in a coffee shop with a friend. He became temporarily confused and was unable to speak clearly the friend drove the patient home and by the time he got home his symptoms have cleared. These have not recurred. His pacemaker was interrogated when last seen on 04/10/11 and showed normal pacemaker function. The patient had an echocardiogram 04/16/11 showing normal ejection fraction of 55-65% and mild mitral regurgitation and no evidence of any thrombi.  The patient had a carotid Doppler in March 2013 which showed nonobstructive disease.   Current Outpatient Prescriptions  Medication Sig Dispense Refill  . acetic acid-hydrocortisone (VOSOL-HC) otic solution As needed      . aspirin 81 MG tablet Take 81 mg by mouth daily.        . carbidopa-levodopa (SINEMET) 25-250 MG per tablet Take 1 tablet by mouth 4 (four) times daily.        . Cholecalciferol (VITAMIN D PO) Take 50,000 Units by mouth once a week.       . cholecalciferol (VITAMIN D) 1000 UNITS tablet Take 1,000 Units by mouth daily.      Marland Kitchen desonide (DESOWEN) 0.05 % lotion As needed      . gabapentin (NEURONTIN) 300 MG capsule Take 300 mg by mouth 4 (four) times daily.        . hydrOXYzine (VISTARIL) 25 MG capsule Take 50 mg by mouth at bedtime.       Marland Kitchen lithium 300 MG capsule Take 300 mg by mouth 1 day or 1 dose.        . metoprolol tartrate (LOPRESSOR) 25 MG tablet Take 25 mg by mouth 2 (two) times daily.        .  Misc. Devices (TRANSPORT CHAIR) MISC 1 Device by Does not apply route once.  1 each  0  . Oxcarbazepine (TRILEPTAL) 300 MG tablet Take 300 mg by mouth 4 (four) times daily.       . Rivastigmine 13.3 MG/24HR PT24 Place 1 patch (13.3 mg total) onto the skin 1 day or 1 dose.  30 patch  5   No current facility-administered medications for this visit.    Allergies  Allergen Reactions  . Penicillins Rash    Patient Active Problem List   Diagnosis Date Noted  . Atrial fibrillation 03/26/2010    Priority: High  . BRADYCARDIA-TACHYCARDIA SYNDROME 03/27/2010    Priority: Medium  . Hypoxemia 05/10/2012  . Sleep disorder 05/10/2012  . Parkinson disease 05/02/2012  . Tremor due to lithium 05/02/2012  . Abdominal aneurysm without mention of rupture 03/15/2012  . ESSENTIAL HYPERTENSION, BENIGN 03/27/2010  . HYPERCHOLESTEROLEMIA 03/26/2010  . BIPOLAR AFFECTIVE DISORDER 03/26/2010  . PARKINSON'S DISEASE 03/26/2010  . PRIMARY PULMONARY HYPERTENSION 03/26/2010  . AORTIC VALVE SCLEROSIS 03/26/2010  . SLEEP APNEA 03/26/2010  . PERSONAL HISTORY MALIGNANT NEOPLASM PROSTATE 03/26/2010    History  Smoking status  . Never Smoker   Smokeless tobacco  . Never Used    History  Alcohol Use No    Family History  Problem Relation Age of Onset  . Heart attack Mother   . Heart disease Father   . Heart disease Brother     Heart Disease before age 68  . Heart attack Brother     Review of Systems: Constitutional: no fever chills diaphoresis or fatigue or change in weight.  Head and neck: no hearing loss, no epistaxis, no photophobia or visual disturbance. Respiratory: No cough, shortness of breath or wheezing. Cardiovascular: No chest pain peripheral edema, palpitations. Gastrointestinal: No abdominal distention, no abdominal pain, no change in bowel habits hematochezia or melena. Genitourinary: No dysuria, no frequency, no urgency, no nocturia. Musculoskeletal:No arthralgias, no back pain, no  gait disturbance or myalgias. Neurological: No dizziness, no headaches, no numbness, no seizures, no syncope, no weakness, no tremors. Hematologic: No lymphadenopathy, no easy bruising. Psychiatric: No confusion, no hallucinations, no sleep disturbance.    Physical Exam: Filed Vitals:   06/08/12 1132  BP: 110/66  Pulse: 79   the general appearance reveals a well-developed elderly gentleman in no distress.  He is in a wheelchair.  He is very sedentary.  He is unsteady on his feet.The head and neck exam reveals pupils equal and reactive.  Extraocular movements are full.  There is no scleral icterus.  The mouth and pharynx are normal.  The neck is supple.  The carotids reveal no bruits.  The jugular venous pressure is normal.  The  thyroid is not enlarged.  There is no lymphadenopathy.  The chest is clear to percussion and auscultation.  There are no rales or rhonchi.  Expansion of the chest is symmetrical.  The pulse is irregularly irregular The precordium is quiet.  The first heart sound is normal.  The second heart sound is physiologically split.  There is no murmur gallop rub or click.  There is no abnormal lift or heave.  The abdomen is soft and nontender.  The bowel sounds are normal.  The liver and spleen are not enlarged.  There are no abdominal masses.  There are no abdominal bruits.  Extremities reveal good pedal pulses.  There is no phlebitis or edema.  There is no cyanosis or clubbing.  Strength is normal and symmetrical in all extremities.  There is no lateralizing weakness.  There are no sensory deficits.  The skin is warm and dry.  There is no rash.    Assessment / Plan: Continue same medication except stop Vytorin because of leg weakness and past history of statin induced myopathy. Recheck in 4 months for followup office visit fasting lipid panel hepatic function panel and basal metabolic panel. We reviewed his carotid Doppler findings from last year.  He had nonobstructive disease.   He declines yearly followup and states he would not be interested in surgery.

## 2012-06-08 NOTE — Assessment & Plan Note (Signed)
The patient has a history of hypercholesterolemia.  He has been on Vytorin.  His wife showed me a copy of Dr. Fayrene Fearing Love's progress note which mentioned statin induced myopathy.  The patient has been having worsening weakness in his lower extremities.  His wife would like him to be off the statin and I agree with her.  We will stop his Vytorin at this point and plan to recheck fasting lab work in 4 months

## 2012-06-08 NOTE — Assessment & Plan Note (Signed)
The patient has a history of atrial fibrillation and is on a baby aspirin because of fall risk.  He is no longer on Coumadin.  He has not had any TIA symptoms.  His last echocardiogram 04/16/11 showed mild mitral regurgitation and an ejection fraction of 55-65%

## 2012-06-08 NOTE — Patient Instructions (Addendum)
STOP VYTORIN  Your physician recommends that you schedule a follow-up appointment in: 4 months with fasting labs (lp/bmet/hfp)

## 2012-06-08 NOTE — Assessment & Plan Note (Signed)
The patient states that he had a recent sleep apnea study at neurology which showed that his overnight oxygen saturation drops to as low as 84%.  He was told that he does not however have sleep apnea.  The family will check with neurology to see if his low oxygen saturations which were documented would qualify him for home oxygen that he could use at night.

## 2012-06-09 ENCOUNTER — Telehealth: Payer: Self-pay | Admitting: Neurology

## 2012-06-09 ENCOUNTER — Telehealth: Payer: Self-pay

## 2012-06-09 NOTE — Telephone Encounter (Signed)
Also, were you going to adjust his levodopa?

## 2012-06-09 NOTE — Telephone Encounter (Signed)
I don't know the answer to that since I have not seen him since this decline.  Do they want to make appt next week?  Have they had PCP check his labs, including urine.  Sometimes UTI can cause decline in function.

## 2012-06-09 NOTE — Telephone Encounter (Signed)
Per wife he is taking the 25/250 at 48, 12, 3 and 6.  He does tend to miss one dose about every third day.  She would like to know if there is a reason for his sudden decline.

## 2012-06-09 NOTE — Telephone Encounter (Signed)
Left msg for pt to call. 

## 2012-06-09 NOTE — Telephone Encounter (Signed)
Patient returned your call.

## 2012-06-09 NOTE — Telephone Encounter (Signed)
Pt's wife calling wanting to know if they can go ahead and change the combination of his carbidopa/levodopa.  His legs are just doing really bad.  He can barely support himself.  He is getting PT/OT at home.  She said he is going downhill fast.  She is open to any other suggestions you may have.

## 2012-06-09 NOTE — Telephone Encounter (Signed)
Hes currently on the 25/250?  Is he taking it 4 times per day (has long hx of noncompliance).  What times of day is he taking it?  Once I get answers to above, I will see what we can do about adjusting the medication.

## 2012-06-09 NOTE — Telephone Encounter (Signed)
Spoke with Mrs. Rosko, she said he did seem to be a little better this afternoon.  He saw his cardiologist yesterday.  She will see how he is tomorrow and if weaker will take him to be seen.  OV made for next week with Dr. Arbutus Leas.

## 2012-06-09 NOTE — Telephone Encounter (Signed)
If he has declined that fast, he needs to go to ER.  Parkinsons doesn't rapidly decline.  I suspect something else is going on and needs to be checked ASAP and then I would like to see him in the office next week.  Don't want to change L-dopa until we figure out what else is going on.

## 2012-06-09 NOTE — Telephone Encounter (Signed)
error 

## 2012-06-13 ENCOUNTER — Ambulatory Visit: Payer: Medicare Other | Admitting: Physical Therapy

## 2012-06-13 ENCOUNTER — Encounter: Payer: Medicare Other | Admitting: Occupational Therapy

## 2012-06-13 ENCOUNTER — Encounter: Payer: Self-pay | Admitting: *Deleted

## 2012-06-15 ENCOUNTER — Encounter: Payer: Self-pay | Admitting: Neurology

## 2012-06-15 ENCOUNTER — Ambulatory Visit (INDEPENDENT_AMBULATORY_CARE_PROVIDER_SITE_OTHER): Payer: Medicare Other | Admitting: Neurology

## 2012-06-15 VITALS — BP 102/62 | HR 72 | Temp 98.0°F | Resp 18

## 2012-06-15 DIAGNOSIS — G9340 Encephalopathy, unspecified: Secondary | ICD-10-CM

## 2012-06-15 DIAGNOSIS — Z79899 Other long term (current) drug therapy: Secondary | ICD-10-CM

## 2012-06-15 DIAGNOSIS — R0902 Hypoxemia: Secondary | ICD-10-CM

## 2012-06-15 DIAGNOSIS — N289 Disorder of kidney and ureter, unspecified: Secondary | ICD-10-CM

## 2012-06-15 DIAGNOSIS — G20A1 Parkinson's disease without dyskinesia, without mention of fluctuations: Secondary | ICD-10-CM

## 2012-06-15 DIAGNOSIS — G20 Parkinson's disease: Secondary | ICD-10-CM

## 2012-06-15 DIAGNOSIS — F319 Bipolar disorder, unspecified: Secondary | ICD-10-CM

## 2012-06-15 DIAGNOSIS — G253 Myoclonus: Secondary | ICD-10-CM | POA: Insufficient documentation

## 2012-06-15 LAB — URINALYSIS
Leukocytes, UA: NEGATIVE
Nitrite: NEGATIVE
Specific Gravity, Urine: 1.015 (ref 1.000–1.030)
Urobilinogen, UA: 0.2 (ref 0.0–1.0)
pH: 6 (ref 5.0–8.0)

## 2012-06-15 LAB — COMPREHENSIVE METABOLIC PANEL
ALT: 8 U/L (ref 0–53)
AST: 17 U/L (ref 0–37)
Albumin: 4.2 g/dL (ref 3.5–5.2)
Alkaline Phosphatase: 90 U/L (ref 39–117)
BUN: 29 mg/dL — ABNORMAL HIGH (ref 6–23)
Calcium: 9.4 mg/dL (ref 8.4–10.5)
Chloride: 104 mEq/L (ref 96–112)
Potassium: 5 mEq/L (ref 3.5–5.1)
Sodium: 137 mEq/L (ref 135–145)
Total Protein: 6.9 g/dL (ref 6.0–8.3)

## 2012-06-15 LAB — CBC WITH DIFFERENTIAL/PLATELET
Basophils Absolute: 0 10*3/uL (ref 0.0–0.1)
Basophils Relative: 0.5 % (ref 0.0–3.0)
Eosinophils Absolute: 0.2 10*3/uL (ref 0.0–0.7)
Lymphocytes Relative: 10.4 % — ABNORMAL LOW (ref 12.0–46.0)
MCHC: 34.3 g/dL (ref 30.0–36.0)
Neutrophils Relative %: 80.7 % — ABNORMAL HIGH (ref 43.0–77.0)
Platelets: 157 10*3/uL (ref 150.0–400.0)
RBC: 4.72 Mil/uL (ref 4.22–5.81)
WBC: 10.4 10*3/uL (ref 4.5–10.5)

## 2012-06-15 MED ORDER — CARBIDOPA-LEVODOPA 25-250 MG PO TABS: 2.0000 | ORAL_TABLET | Freq: Four times a day (QID) | ORAL | Status: DC

## 2012-06-15 NOTE — Patient Instructions (Addendum)
Your appointment with Dr. Sherene Sires, the pulmonologist, is scheduled for tomorrow at 3:15pm.  [address], across from [place], [phone].

## 2012-06-15 NOTE — Progress Notes (Signed)
Jacob Boyd was seen today in the movement disorders clinic for neurologic f/u.    The patients wife states he was dx with ET when the patient was about 79 years old.  He was not put on medication for this initially, but was later put on inderal and had SE with that.  They are unusure of when the dx of PD came in but it has been over 10 years. They do not know the first symptom. They do not know when levodopa was added and are unsure if it is helpful.  He is on carbidopa/levodopa 25/250 three to four times per day but is very noncompliant.  He administers his own medication.  He initially told me that he takes his levodopa this morning, but it turns out he has not yet taken it today.  He has been on lithium since the 1960's.  He is on it for bipolar d/o.  His wife relates that he was told by Dr. Sandria Manly that his parkinsonism is due to lithium, but the psychiatrist told him that it was not.  He has been on the atypical antipsychotic medications in the past, but has been off of those for many years.   06/15/2012 The patient was seen sooner than expected, as his wife states that he just has not been doing well lately.  She reports that he has had episodes of similar for years now.  These episodes are intermittent, but this time it has seemed to last somewhat longer.  At times during the day he does very well and at other times he is unable to get up because his legs are weak.  He seems more confused.  He did see Dr. Frances Furbish since our last visit and she diagnosed him with nocturnal hypoxemia, independent of sleep apnea.  She referred him for pulmonology, but his wife asks me to refer him because they never heard anything about the referral.  He episodes of weakness seem to be completely independent of the timing of his levodopa.  They can occur anytime of day or night.  Sometimes, she does not feel that she can even get him into the car, although today she was able to manage quite nicely.  They have switched  therapy so that he is now doing home PT and OT, but his wife states that sometimes he is restless and virtually unable to participate.  They have noticed jerking of the arms primarily, but sometimes of the legs.  His wife stated that also has been going on for many years.  He does have a history of renal insufficiency.  His wife states that his creatinine has been stable now for quite some time and it is usually about 1.5.  He is on Neurontin for burning paresthesias associated with peripheral neuropathy, although his wife states that those are gone now and she is not sure if that is from the Neurontin or just because he no longer has the symptoms.  Wearing off:  no  How long before next dose:  n/a Falls:   yes but no serious injuries since last visit. N/V:  no Hallucinations:  no  visual distortions: no Lightheaded:  no  Syncope: no Dyskinesia:  no    PREVIOUS MEDICATIONS: Seroquel, risperdol  ALLERGIES:   Allergies  Allergen Reactions  . Penicillins Rash    CURRENT MEDICATIONS:  Current Outpatient Prescriptions on File Prior to Visit  Medication Sig Dispense Refill  . acetic acid-hydrocortisone (VOSOL-HC) otic solution As needed      .  aspirin 81 MG tablet Take 81 mg by mouth daily.        . carbidopa-levodopa (SINEMET) 25-250 MG per tablet Take 1 tablet by mouth 4 (four) times daily.        . Cholecalciferol (VITAMIN D PO) Take 50,000 Units by mouth once a week.       . cholecalciferol (VITAMIN D) 1000 UNITS tablet Take 1,000 Units by mouth daily.      Marland Kitchen desonide (DESOWEN) 0.05 % lotion As needed      . gabapentin (NEURONTIN) 300 MG capsule Take 300 mg by mouth 4 (four) times daily.        . hydrOXYzine (VISTARIL) 25 MG capsule Take 50 mg by mouth at bedtime.       Marland Kitchen lithium 300 MG capsule Take 300 mg by mouth 1 day or 1 dose.        . metoprolol tartrate (LOPRESSOR) 25 MG tablet Take 25 mg by mouth 2 (two) times daily.        . Oxcarbazepine (TRILEPTAL) 300 MG tablet Take 300 mg  by mouth 4 (four) times daily.       . Rivastigmine 13.3 MG/24HR PT24 Place 1 patch (13.3 mg total) onto the skin 1 day or 1 dose.  30 patch  5   No current facility-administered medications on file prior to visit.    PAST MEDICAL HISTORY:   Past Medical History  Diagnosis Date  . Prostate cancer     with radiation  . Bipolar 1 disorder   . Coronary artery disease   . Atrial fibrillation     persistent, with atypical atrial flutter  . Tachycardia-bradycardia     s/p PPM by Dr Reyes Ivan, Gen change by Fawn Kirk 3/12  . Parkinson's disease   . Hypertension   . AAA (abdominal aortic aneurysm)     medical management  . Dementia     mild  . DVT (deep venous thrombosis)   . Hypoxemia 05/10/2012  . Sleep disorder 05/10/2012    PAST SURGICAL HISTORY:   Past Surgical History  Procedure Laterality Date  . Nasal sinus surgery    . Pacemaker insertion  04/08/2010    Initial pacemaker by Dr Reyes Ivan, gen change by Fawn Kirk (MDT) 3/12  . Cataract extraction w/ intraocular lens  implant, bilateral      SOCIAL HISTORY:   History   Social History  . Marital Status: Married    Spouse Name: N/A    Number of Children: N/A  . Years of Education: N/A   Occupational History  . Not on file.   Social History Main Topics  . Smoking status: Never Smoker   . Smokeless tobacco: Never Used  . Alcohol Use: No  . Drug Use: No  . Sexually Active: Not on file   Other Topics Concern  . Not on file   Social History Narrative  . No narrative on file    FAMILY HISTORY:   Family Status  Relation Status Death Age  . Mother Deceased 77    old age  . Father Deceased 42    Heart disease  . Sister Alive     dementia  . Brother Deceased     4, MI, MVA, ?NPH, trauma  . Child Alive   . Child Alive   . Child Alive     ROS:  A complete 10 system review of systems was obtained and was unremarkable apart from what is mentioned above.  PHYSICAL EXAMINATION:  VITALS:   Filed Vitals:   06/15/12 1046  BP:  102/62  Pulse: 72  Temp: 98 F (36.7 C)  Resp: 18    GEN:  The patient appears stated age and is in NAD. HEENT:  Normocephalic, atraumatic.  The mucous membranes are moist. The superficial temporal arteries are without ropiness or tenderness. CV:  RRR Lungs:  CTAB Neck/HEME:  There are no carotid bruits bilaterally.  Neurological examination:  Orientation: The patient scored a 14/30 on his MoCA last visit.. Cranial nerves: There is good facial symmetry. Pupils are equal round and reactive to light bilaterally. Fundoscopic exam reveals clear margins bilaterally. Extraocular muscles are intact. The visual fields are full to confrontational testing. The speech is fluent and clear. Soft palate rises symmetrically and there is no tongue deviation. Hearing is decreased to conversational tone. Sensation: Sensation is intact to light and pinprick throughout (facial, trunk, extremities). Vibration is intact at the bilateral big toe. There is no extinction with double simultaneous stimulation. There is no sensory dermatomal level identified. Motor: Strength is 5/5 in the bilateral upper and lower extremities.   Shoulder shrug is equal and symmetric.  There is no pronator drift. Deep tendon reflexes: Deep tendon reflexes are 3/4 at the bilateral biceps, triceps, brachioradialis, patella and absent at the bilateral achilles. Plantar responses are downgoing bilaterally.  Movement examination: Tone: There is increased tone in the right upper extremity only today.  Last visit it was both arms.  The tone in the legs is normal. Abnormal movements: There is both a resting and postural tremor.  I didn't see where an intermittent myoclonus, primarily of the left hand. Coordination:  There is definite decremation with RAM's, seen more prominently in the left hemibody than the right, but seen with all forms including alternating supination and pronation of the forearm, hand opening and closing, finger taps, heel  taps and toe taps. Gait and Station: The patient has much more difficulty getting up today than last visit.  His stride length was markedly decreased and he was shuffling.  His wife does report that it is time for his next dosage of levodopa.       LABS:    Chemistry      Component Value Date/Time   NA 139 12/22/2011 1044   K 4.7 12/22/2011 1044   CL 104 12/22/2011 1044   CO2 27 12/22/2011 1044   BUN 36* 12/22/2011 1044   CREATININE 1.5 12/22/2011 1044      Component Value Date/Time   CALCIUM 9.1 12/22/2011 1044   ALKPHOS 84 12/22/2011 1044   AST 25 12/22/2011 1044   ALT 26 12/22/2011 1044   BILITOT 0.6 12/22/2011 1044       ASSESSMENT/PLAN:  1.  Parkinsonism.  I suspect that this does represent idiopathic Parkinson's disease.  I reassured them that lithium is not the cause of the parkinsonism, but it certainly is contributing to tremor.  We talked about the fact that lithium does not cause parkinsonism, although some of the other atypical antipsychotic that he has been on certainly could.  Nonetheless, tardive parkinsonism is extremely rare and I do not suspect this.  I suspect that when he was diagnosed with essential tremor years ago, that he had a lithium-induced tremor and not essential tremor.  -I am going to keep him on the levodopa, but I am going to change him to 25/100 and he will take 2 tablets 4 times a day.  Risks, benefits, side effects and alternative therapies were  discussed.  The opportunity to ask questions was given and they were answered to the best of my ability.  The patient expressed understanding and willingness to follow the outlined treatment protocols.  -I wonder if there is something else medically going on, that is causing the worsening of his symptoms.  I am going to check a CBC, CMP, ammonia, TSH, lithium level, and CPK and sedimentation rate.  He will also have a urinalysis with culture and sensitivity. 2.  Myoclonus.  -I wonder if this is due to  Neurontin in the setting of renal insufficiency.  I will check his renal function today, and next visit we may back off on the Neurontin.  I'm not sure that he even needed any longer. 3.  nocturnal hypoxemia.  -His sleep study showed no evidence of sleep apnea, but did show oxygen saturations in the low 80s.  I will refer him to pulmonology. 4.  Memory loss, likely related to parkinsonism.  - he will continue with Exelon patch, 13.3 mg.  -We talked about the importance of both physical and mental exercises relates to memory. 5.  Bipolar disorder.  -He is under the care of a psychiatrist.  He has failed and had significant side effects to the atypical antipsychotics, particularly Seroquel. 5  I will plan on seeing the patient back in 2 weeks, sooner should new neurologic issues arise.

## 2012-06-16 ENCOUNTER — Ambulatory Visit (INDEPENDENT_AMBULATORY_CARE_PROVIDER_SITE_OTHER): Payer: Medicare Other | Admitting: Internal Medicine

## 2012-06-16 ENCOUNTER — Encounter: Payer: Self-pay | Admitting: Internal Medicine

## 2012-06-16 ENCOUNTER — Ambulatory Visit: Payer: Medicare Other | Admitting: Physical Therapy

## 2012-06-16 ENCOUNTER — Encounter: Payer: Medicare Other | Admitting: Occupational Therapy

## 2012-06-16 ENCOUNTER — Ambulatory Visit (INDEPENDENT_AMBULATORY_CARE_PROVIDER_SITE_OTHER)
Admission: RE | Admit: 2012-06-16 | Discharge: 2012-06-16 | Disposition: A | Discharge status: Home / Self Care | Payer: Medicare Other | Source: Ambulatory Visit | Attending: Internal Medicine | Admitting: Internal Medicine

## 2012-06-16 VITALS — BP 128/84 | HR 102 | Temp 98.1°F | Ht 70.0 in | Wt 181.0 lb

## 2012-06-16 DIAGNOSIS — R918 Other nonspecific abnormal finding of lung field: Secondary | ICD-10-CM

## 2012-06-16 DIAGNOSIS — R0902 Hypoxemia: Secondary | ICD-10-CM

## 2012-06-16 LAB — REMOTE PACEMAKER DEVICE
AL IMPEDENCE PM: 543 Ohm
ATRIAL PACING PM: 30
BATTERY VOLTAGE: 2.79 V
BRDY-0004RV: 130 {beats}/min
RV LEAD THRESHOLD: 0.875 V
VENTRICULAR PACING PM: 25

## 2012-06-16 NOTE — Patient Instructions (Addendum)
Please see patient coordinator before you leave today  to schedule overnight pulse oximetry on RA  Please remember to go to the  x-ray department downstairs for your tests - we will call you with the results when they are available.

## 2012-06-16 NOTE — Progress Notes (Signed)
  Subjective:    Patient ID: Jacob Boyd, male    DOB: 25-Jun-1927  MRN: [id number]  HPI  27 yowm never smoker never resp problems eval by [facility] for OSA with low noct 02 sats.  06/16/2012 1st pulmonary eval cc daytime drowsiness x sev years wakes variably rested with hypersomnia if unstimulated after bfast, variably feels rested in am but not aware of any problems while sleeping with sob. Very limited by Parkinson's dz but no limiting sob.   Despite difficulty with swallowing no h/o asp pna or exp to meds that cause PF.  No obvious variabilty to hypersomonolence or assoc chronic cough or cp or chest tightness, subjective wheeze overt sinus or hb symptoms. No unusual exp hx or h/o childhood pna/ asthma or premature birth to his knowledge.     Also denies any obvious fluctuation of symptoms with weather or environmental changes or other aggravating or alleviating factors except as outlined above   Review of Systems  Constitutional: Negative for fever, chills, activity change, appetite change and unexpected weight change.  HENT: Positive for trouble swallowing. Negative for congestion, sore throat, rhinorrhea, sneezing, dental problem, voice change and postnasal drip.   Eyes: Negative for visual disturbance.  Respiratory: Negative for cough, choking and shortness of breath.   Cardiovascular: Negative for chest pain and leg swelling.  Gastrointestinal: Negative for nausea, vomiting and abdominal pain.  Genitourinary: Negative for difficulty urinating.  Musculoskeletal: Negative for arthralgias.  Skin: Positive for rash.  Psychiatric/Behavioral: Negative for behavioral problems and confusion.       Objective:   Physical Exam Wt Readings from Last 3 Encounters:  06/16/12 181 lb (82.101 kg)  05/10/12 182 lb (82.555 kg)  05/02/12 182 lb (82.555 kg)    Frail elderly wm nad with nl vital signs  HEENT: nl dentition, turbinates, and orophanx. Nl external ear canals without cough  reflex   NECK :  without JVD/Nodes/TM/ nl carotid upstrokes bilaterally   LUNGS: no acc muscle use, clear to A and P bilaterally without cough on insp or exp maneuvers   CV:  RRR  no s3 or murmur or increase in P2, no edema   ABD:  soft and nontender with nl excursion in the supine position. No bruits or organomegaly, bowel sounds nl  MS:  warm without deformities, calf tenderness, cyanosis or clubbing  SKIN: warm and dry without lesions    NEURO: inconsistent answers to questions, no deficits, resting tremor    CXR  06/16/2012 :  No acute cardiopulmonary disease.  Small nodular opacity over the right mid to lower lung. Recommend a follow-up chest radiograph 4-6 weeks.      Assessment & Plan:

## 2012-06-17 ENCOUNTER — Telehealth: Payer: Self-pay | Admitting: Internal Medicine

## 2012-06-17 DIAGNOSIS — R918 Other nonspecific abnormal finding of lung field: Secondary | ICD-10-CM | POA: Insufficient documentation

## 2012-06-17 LAB — URINE CULTURE: Colony Count: 30000

## 2012-06-17 NOTE — Progress Notes (Signed)
Quick Note:  Spoke with pt's spouse and notified of results Msg sent to MW to call her as she needs further clarification ______

## 2012-06-17 NOTE — Telephone Encounter (Signed)
Spoke with Harriett Sine and notified of results/recs per MW She states that she is needing further clarification on "abnormlity" and would like for MW to call her Please call the pt thanks!

## 2012-06-17 NOTE — Assessment & Plan Note (Signed)
Cxr reviewed and agree very tiny nodule R mid lung likely of no clinical significance > will review with pt and wife at next ov

## 2012-06-17 NOTE — Telephone Encounter (Signed)
Discussed with pts wife very unlikely this is anything but needs f/u

## 2012-06-17 NOTE — Telephone Encounter (Signed)
Spoke with patient wife, she has already spoke with Verlon Au and msg has been forwarded to Dr. Sherene Sires Will sign off on this msg.

## 2012-06-17 NOTE — Assessment & Plan Note (Signed)
Not clear he really has significant enough noct hypoxemia to cause symptoms or to warrant 02 so need to repeat full ono RA and see if the sats and symptoms can be corrected on 02 and if not then consult one of our sleep specialists

## 2012-06-17 NOTE — Telephone Encounter (Signed)
noted 

## 2012-06-24 ENCOUNTER — Encounter: Payer: Self-pay | Admitting: *Deleted

## 2012-07-04 ENCOUNTER — Ambulatory Visit (INDEPENDENT_AMBULATORY_CARE_PROVIDER_SITE_OTHER): Payer: Medicare Other | Admitting: Neurology

## 2012-07-04 ENCOUNTER — Encounter: Payer: Self-pay | Admitting: Neurology

## 2012-07-04 ENCOUNTER — Encounter: Payer: Self-pay | Admitting: Internal Medicine

## 2012-07-04 ENCOUNTER — Other Ambulatory Visit: Payer: Self-pay

## 2012-07-04 VITALS — BP 100/64 | HR 84 | Temp 97.9°F | Resp 18

## 2012-07-04 DIAGNOSIS — G4752 REM sleep behavior disorder: Secondary | ICD-10-CM | POA: Insufficient documentation

## 2012-07-04 DIAGNOSIS — G253 Myoclonus: Secondary | ICD-10-CM

## 2012-07-04 DIAGNOSIS — G609 Hereditary and idiopathic neuropathy, unspecified: Secondary | ICD-10-CM | POA: Insufficient documentation

## 2012-07-04 DIAGNOSIS — G20 Parkinson's disease: Secondary | ICD-10-CM | POA: Insufficient documentation

## 2012-07-04 DIAGNOSIS — R0902 Hypoxemia: Secondary | ICD-10-CM

## 2012-07-04 DIAGNOSIS — G252 Other specified forms of tremor: Secondary | ICD-10-CM

## 2012-07-04 DIAGNOSIS — T43591A Poisoning by other antipsychotics and neuroleptics, accidental (unintentional), initial encounter: Secondary | ICD-10-CM

## 2012-07-04 DIAGNOSIS — G251 Drug-induced tremor: Secondary | ICD-10-CM

## 2012-07-04 MED ORDER — CARBIDOPA-LEVODOPA 25-250 MG PO TABS: ORAL_TABLET | ORAL | Status: DC

## 2012-07-04 MED ORDER — CARBIDOPA-LEVODOPA 25-100 MG PO TABS: ORAL_TABLET | ORAL | Status: DC

## 2012-07-04 MED ORDER — CLONAZEPAM 0.5 MG PO TABS: ORAL_TABLET | ORAL | Status: DC

## 2012-07-04 NOTE — Progress Notes (Signed)
Jacob Boyd was seen today in the movement disorders clinic for neurologic f/u.    The patients wife states he was dx with ET when the patient was about 79 years old.  He was not put on medication for this initially, but was later put on inderal and had SE with that.  They are unusure of when the dx of PD came in but it has been over 10 years. They do not know the first symptom. They do not know when levodopa was added and are unsure if it is helpful.  He is on carbidopa/levodopa 25/250 three to four times per day but is very noncompliant.  He administers his own medication.  He initially told me that he takes his levodopa this morning, but it turns out he has not yet taken it today.  He has been on lithium since the 1960's.  He is on it for bipolar d/o.  His wife relates that he was told by Dr. Sandria Manly that his parkinsonism is due to lithium, but the psychiatrist told him that it was not.  He has been on the atypical antipsychotic medications in the past, but has been off of those for many years.   07/04/2012 The patient was having myoclonic jerks last visit, that I thought were likely due to the Neurontin in the face of renal insufficiency.  We decreased the Neurontin and the myoclonus has virtually disappeared, but unfortunately he is now having coldness and paresthesias in the feet.  This has become bothersome for him.  He is not exercising.  His wife states that she has bothered him about this but he has not done it.  He has done some physical therapy, but this has been at home and they are not sure if it is helpful.  He had a pulmonary consult since last visit regarding nocturnal hypoxemia.  They did a chest x-ray demonstrating inconsequential lung nodule, but otherwise there were no further additional recommendations.  The patient continues to have days where he does very good and days where he is not so good.  These seem unpredictable.  He is currently on 1000 mg of levodopa per day, and because his  wife is administering it now, he is taking a much more regularly.  He takes 25/254 times per day.  He does note that he is able to drink his coffee now without spilling it.  The patient states that he has gotten up in the middle of the night and "chased bees."  This has done so series for his wife actually blocks the bedroom door at night so that he cannot get out.  He apparently was on clonazepam in the past for bipolar disorder, but they are unsure if he had a side effect with it.  Wearing off:  no  How long before next dose:  n/a Falls:   yes but no serious injuries since last visit. N/V:  no Hallucinations:  no  visual distortions: no Lightheaded:  no  Syncope: no Dyskinesia:  no    PREVIOUS MEDICATIONS: Seroquel, risperdol  ALLERGIES:   Allergies  Allergen Reactions  . Penicillins Rash    CURRENT MEDICATIONS:  Current Outpatient Prescriptions on File Prior to Visit  Medication Sig Dispense Refill  . acetic acid-hydrocortisone (VOSOL-HC) otic solution As needed      . aspirin 81 MG tablet Take 81 mg by mouth daily.        . carbidopa-levodopa (SINEMET IR) 25-250 MG per tablet Take 2 tablets by mouth  4 (four) times daily.  240 tablet  5  . Cholecalciferol (VITAMIN D PO) Take 50,000 Units by mouth once a week.       . cholecalciferol (VITAMIN D) 1000 UNITS tablet Take 1,000 Units by mouth daily.      Marland Kitchen desonide (DESOWEN) 0.05 % lotion As needed      . gabapentin (NEURONTIN) 300 MG capsule Take 300 mg by mouth 2 (two) times daily.       . hydrOXYzine (VISTARIL) 25 MG capsule Take 50 mg by mouth at bedtime.       Marland Kitchen lithium 300 MG capsule Take 300 mg by mouth 1 day or 1 dose.        . metoprolol tartrate (LOPRESSOR) 25 MG tablet Take 25 mg by mouth 2 (two) times daily.        . Oxcarbazepine (TRILEPTAL) 300 MG tablet Take 300 mg by mouth 4 (four) times daily.       . Rivastigmine 13.3 MG/24HR PT24 Place 1 patch (13.3 mg total) onto the skin 1 day or 1 dose.  30 patch  5   No  current facility-administered medications on file prior to visit.    PAST MEDICAL HISTORY:   Past Medical History  Diagnosis Date  . Prostate cancer     with radiation  . Bipolar 1 disorder   . Coronary artery disease   . Atrial fibrillation     persistent, with atypical atrial flutter  . Tachycardia-bradycardia     s/p PPM by Dr Reyes Ivan, Gen change by Fawn Kirk 3/12  . Parkinson's disease   . Hypertension   . AAA (abdominal aortic aneurysm)     medical management  . Dementia     mild  . DVT (deep venous thrombosis)   . Hypoxemia 05/10/2012  . Sleep disorder 05/10/2012    PAST SURGICAL HISTORY:   Past Surgical History  Procedure Laterality Date  . Nasal sinus surgery    . Pacemaker insertion  04/08/2010    Initial pacemaker by Dr Reyes Ivan, gen change by Fawn Kirk (MDT) 3/12  . Cataract extraction w/ intraocular lens  implant, bilateral      SOCIAL HISTORY:   History   Social History  . Marital Status: Married    Spouse Name: N/A    Number of Children: N/A  . Years of Education: N/A   Occupational History  . Not on file.   Social History Main Topics  . Smoking status: Never Smoker   . Smokeless tobacco: Never Used  . Alcohol Use: No  . Drug Use: No  . Sexually Active: Not on file   Other Topics Concern  . Not on file   Social History Narrative  . No narrative on file    FAMILY HISTORY:   Family Status  Relation Status Death Age  . Mother Deceased 12    old age  . Father Deceased 96    Heart disease  . Sister Alive     dementia  . Brother Deceased     4, MI, MVA, ?NPH, trauma  . Child Alive   . Child Alive   . Child Alive     ROS:  A complete 10 system review of systems was obtained and was unremarkable apart from what is mentioned above.  PHYSICAL EXAMINATION:    VITALS:   Filed Vitals:   07/04/12 1044  BP: 100/64  Pulse: 84  Temp: 97.9 F (36.6 C)  Resp: 18    GEN:  The patient  appears stated age and is in NAD. HEENT:  Normocephalic, atraumatic.   The mucous membranes are moist. The superficial temporal arteries are without ropiness or tenderness. CV:  RRR Lungs:  CTAB Neck/HEME:  There are no carotid bruits bilaterally.  Neurological examination:  Orientation: The patient scored a 14/30 on his MoCA last visit.. Cranial nerves: There is good facial symmetry. Pupils are equal round and reactive to light bilaterally. Fundoscopic exam reveals clear margins bilaterally. Extraocular muscles are intact. The visual fields are full to confrontational testing. The speech is fluent and clear. Soft palate rises symmetrically and there is no tongue deviation. Hearing is decreased to conversational tone. Sensation: Sensation is intact to light and pinprick throughout (facial, trunk, extremities). Vibration is intact at the bilateral big toe. There is no extinction with double simultaneous stimulation. There is no sensory dermatomal level identified. Motor: Strength is 5/5 in the bilateral upper and lower extremities.   Shoulder shrug is equal and symmetric.  There is no pronator drift. Deep tendon reflexes: Deep tendon reflexes are 3/4 at the bilateral biceps, triceps, brachioradialis, patella and absent at the bilateral achilles. Plantar responses are downgoing bilaterally.  Movement examination: Tone: There is increased tone in the right upper extremity only today.  Last visit it was both arms.  The tone in the legs is normal. Abnormal movements: There is both a resting and postural tremor.  Coordination:  There is definite decremation with RAM's, seen more prominently in the left hemibody than the right, but seen with all forms including alternating supination and pronation of the forearm, hand opening and closing, finger taps, heel taps and toe taps. Gait and Station: The patient is able to walk better than last visit.  He does have a festinating gait and shuffles.  He was examined 3-1/2 hours after his last dose of levodopa, and it is almost a time  for his next.  LABS:    Chemistry      Component Value Date/Time   NA 137 06/15/2012 1149   K 5.0 06/15/2012 1149   CL 104 06/15/2012 1149   CO2 27 06/15/2012 1149   BUN 29* 06/15/2012 1149   CREATININE 1.7* 06/15/2012 1149      Component Value Date/Time   CALCIUM 9.4 06/15/2012 1149   ALKPHOS 90 06/15/2012 1149   AST 17 06/15/2012 1149   ALT 8 06/15/2012 1149   BILITOT 0.6 06/15/2012 1149       ASSESSMENT/PLAN:  1.  Parkinsonism.  I suspect that this does represent idiopathic Parkinson's disease.  I reassured them that lithium is not the cause of the parkinsonism, but it certainly is contributing to tremor.  We talked about the fact that lithium does not cause parkinsonism, although some of the other atypical antipsychotic that he has been on certainly could.  Nonetheless, tardive parkinsonism is extremely rare and I do not suspect this.  I suspect that when he was diagnosed with essential tremor years ago, that he had a lithium-induced tremor and not essential tremor.  -I am going to keep him on the levodopa, but I am going to change him to 25/100 and he will take 2 tablets 5 times a day.  Risks, benefits, side effects and alternative therapies were discussed.  The opportunity to ask questions was given and they were answered to the best of my ability.  The patient expressed understanding and willingness to follow the outlined treatment protocols. 2.  Myoclonus.  -This was due to the Neurontin in the setting of  renal insufficiency.  Decreasing the Neurontin dose has helped, but the neuropathy symptoms have come back. 3.  nocturnal hypoxemia.  -His sleep study showed no evidence of sleep apnea, but did show oxygen saturations in the low 80s.  He had a pulmonology consult, and there were no additional recommendations. 4.  Memory loss, likely related to parkinsonism.  - he will continue with Exelon patch, 13.3 mg.  -We talked about the importance of both physical and mental exercises relates to  memory. 5.  Bipolar disorder.  -He is under the care of a psychiatrist.  He has failed and had significant side effects to the atypical antipsychotics, particularly Seroquel. 6.  REM behavior disorder.  -I am going to clonazepam.  He will just take half tablet at night.  -I discussed with the patient and his wife that I do not want them blocking the door that he cannot get out.  This is a Air cabin crew hazard. 7.  low TSH.  -The patient has an appointment with Dr. Clelia Croft tomorrow.

## 2012-07-04 NOTE — Patient Instructions (Addendum)
1.  Exercise every single day 2.  Try klonopin at night - 0.5 mg - 1/2 to one tablet at night before bedtime as we discussed 3.  Carbidopa/levodopa - 25/100 - 2 tablets 5 times per day

## 2012-07-06 ENCOUNTER — Telehealth: Payer: Self-pay | Admitting: Internal Medicine

## 2012-07-06 NOTE — Telephone Encounter (Signed)
Per MW ONO okay for now, will discuss details at [facility]

## 2012-07-06 NOTE — Telephone Encounter (Signed)
Spouse aware and is requesting results be mailed to them. Please advise Verlon Au. thanks

## 2012-07-11 ENCOUNTER — Encounter: Payer: Medicare Other | Admitting: Internal Medicine

## 2012-07-14 NOTE — Telephone Encounter (Signed)
Results still not scanned in yet Will await for them to be scanned and then mail

## 2012-08-03 ENCOUNTER — Other Ambulatory Visit: Payer: Self-pay | Admitting: Internal Medicine

## 2012-08-03 ENCOUNTER — Ambulatory Visit (INDEPENDENT_AMBULATORY_CARE_PROVIDER_SITE_OTHER): Payer: Medicare Other | Admitting: Internal Medicine

## 2012-08-03 ENCOUNTER — Encounter: Payer: Self-pay | Admitting: Internal Medicine

## 2012-08-03 VITALS — BP 121/76 | HR 70 | Ht 70.0 in | Wt 169.6 lb

## 2012-08-03 DIAGNOSIS — I4891 Unspecified atrial fibrillation: Secondary | ICD-10-CM

## 2012-08-03 DIAGNOSIS — I495 Sick sinus syndrome: Secondary | ICD-10-CM

## 2012-08-03 NOTE — Patient Instructions (Addendum)
Remote monitoring is used to monitor your Pacemaker of ICD from home. This monitoring reduces the number of office visits required to check your device to one time per year. It allows [place] to keep an eye on the functioning of your device to ensure it is working properly. You are scheduled for a device check from home on November 07, 2012. You may send your transmission at any time that day. If you have a wireless device, the transmission will be sent automatically. After your physician reviews your transmission, you will receive a postcard with your next transmission date.  Your physician wants you to follow-up in: 1 year with Dr Jacquiline Doe will receive a reminder letter in the mail two months in advance. If you don't receive a letter, please call our office to schedule the follow-up appointment.

## 2012-08-03 NOTE — Progress Notes (Signed)
PCP: Kari Baars, MD Primary Cardiologist: Cassell Clement, MD  Jacob Boyd is a 79 y.o. male who presents today for routine electrophysiology followup.  Since last being seen in our clinic, he has been followed by Dr Tat with neurology for essential tremor.  He is having trouble ambulating.  His coumadin was stopped by Drs Clelia Croft and Patty Sermons due to frequent falls.  Today, he denies symptoms of palpitations, chest pain, shortness of breath,  lower extremity edema, dizziness, presyncope, or syncope.  The patient is otherwise without complaint today.   Past Medical History  Diagnosis Date  . Prostate cancer     with radiation  . Bipolar 1 disorder   . Coronary artery disease   . Atrial fibrillation     persistent, with atypical atrial flutter  . Tachycardia-bradycardia     s/p PPM by Dr Reyes Ivan, Gen change by Fawn Kirk 3/12  . Parkinson's disease   . Hypertension   . AAA (abdominal aortic aneurysm)     medical management  . Dementia     mild  . DVT (deep venous thrombosis)   . Hypoxemia 05/10/2012  . Sleep disorder 05/10/2012   Past Surgical History  Procedure Laterality Date  . Nasal sinus surgery    . Pacemaker insertion  04/08/2010    Initial pacemaker by Dr Reyes Ivan, gen change by Fawn Kirk (MDT) 3/12  . Cataract extraction w/ intraocular lens  implant, bilateral      Current Outpatient Prescriptions  Medication Sig Dispense Refill  . acetic acid-hydrocortisone (VOSOL-HC) otic solution As needed      . aspirin 81 MG tablet Take 81 mg by mouth daily.        . carbidopa-levodopa (SINEMET IR) 25-100 MG per tablet 2 tabs five times a day.  300 tablet  3  . Cholecalciferol (VITAMIN D PO) Take 50,000 Units by mouth once a week.       . cholecalciferol (VITAMIN D) 1000 UNITS tablet Take 1,000 Units by mouth daily.      Marland Kitchen desonide (DESOWEN) 0.05 % lotion As needed      . gabapentin (NEURONTIN) 300 MG capsule Take 300 mg by mouth 2 (two) times daily.       . hydrOXYzine (VISTARIL) 25 MG capsule  Take 50 mg by mouth at bedtime.       Marland Kitchen lithium 300 MG capsule Take 300 mg by mouth 1 day or 1 dose.        . metoprolol tartrate (LOPRESSOR) 25 MG tablet Take 25 mg by mouth 2 (two) times daily.        . Oxcarbazepine (TRILEPTAL) 300 MG tablet Take 300 mg by mouth 4 (four) times daily.       . Rivastigmine 13.3 MG/24HR PT24 Place 1 patch (13.3 mg total) onto the skin 1 day or 1 dose.  30 patch  5  . clonazePAM (KLONOPIN) 0.5 MG tablet 1/2-1 qhs  30 tablet  3   No current facility-administered medications for this visit.    Physical Exam: Filed Vitals:   08/03/12 0944  BP: 121/76  Pulse: 70  Height: 5\' 10"  (1.778 m)  Weight: 169 lb 9.6 oz (76.93 kg)    GEN- The patient is chronically ill appearing, alert and oriented x 3 today.   Head- normocephalic, atraumatic Eyes-  Sclera clear, conjunctiva pink Ears- hearing intact Oropharynx- clear Lungs- Clear to ausculation bilaterally, normal work of breathing Chest- pacemaker pocket is well healed Heart- Regular rate and rhythm, no murmurs, rubs or  gallops, PMI not laterally displaced GI- soft, NT, ND, + BS Extremities- no clubbing, cyanosis, or edema  Pacemaker interrogation- reviewed in detail today,  See PACEART report  Assessment and Plan:  1. Symptomatic bradycardia Normal pacemaker function See Pace Art report No changes today  2. afib Ideally he should be anticoagulated Could consider eliquis given the AVEREOES data which suggests bleeding risks similar to ASA with significant reduction in stroke.  I will defer this decision to Drs Clelia Croft and Patty Sermons  Return in 1 year

## 2012-08-13 LAB — PACEMAKER DEVICE OBSERVATION
AL AMPLITUDE: 5.6 mv
AL IMPEDENCE PM: 544 Ohm
ATRIAL PACING PM: 39.1
BATTERY VOLTAGE: 2.79 V
RV LEAD IMPEDENCE PM: 436 Ohm
RV LEAD THRESHOLD: 0.625 V
VENTRICULAR PACING PM: 34.6

## 2012-08-19 ENCOUNTER — Encounter: Payer: Self-pay | Admitting: Internal Medicine

## 2012-08-30 ENCOUNTER — Encounter: Payer: Medicare Other | Admitting: Neurology

## 2012-09-06 ENCOUNTER — Telehealth: Payer: Self-pay | Admitting: *Deleted

## 2012-09-06 NOTE — Telephone Encounter (Signed)
Spoke with the pt's spouse Appt was set for 09/08/12 at 1:30 pm

## 2012-09-06 NOTE — Telephone Encounter (Signed)
Message copied by Christen Butter on Tue Sep 06, 2012 12:13 PM ------      Message from: Nyoka Cowden      Created: Fri Jun 17, 2012  7:42 AM       Needs f/u cxr by now  ------

## 2012-09-07 ENCOUNTER — Telehealth: Payer: Self-pay | Admitting: Neurology

## 2012-09-07 NOTE — Telephone Encounter (Signed)
Transfer of care Dr. Love needs to be reassigned per Dr. Athar °

## 2012-09-08 ENCOUNTER — Encounter: Payer: Self-pay | Admitting: Internal Medicine

## 2012-09-08 ENCOUNTER — Ambulatory Visit (INDEPENDENT_AMBULATORY_CARE_PROVIDER_SITE_OTHER)
Admission: RE | Admit: 2012-09-08 | Discharge: 2012-09-08 | Disposition: A | Discharge status: Home / Self Care | Payer: Medicare Other | Source: Ambulatory Visit | Attending: Internal Medicine | Admitting: Internal Medicine

## 2012-09-08 ENCOUNTER — Ambulatory Visit (INDEPENDENT_AMBULATORY_CARE_PROVIDER_SITE_OTHER): Payer: Medicare Other | Admitting: Internal Medicine

## 2012-09-08 VITALS — BP 120/72 | HR 64 | Temp 97.0°F | Ht 70.0 in | Wt 178.8 lb

## 2012-09-08 DIAGNOSIS — R918 Other nonspecific abnormal finding of lung field: Secondary | ICD-10-CM

## 2012-09-08 DIAGNOSIS — R0902 Hypoxemia: Secondary | ICD-10-CM

## 2012-09-08 NOTE — Assessment & Plan Note (Signed)
Most likely from intermittent asp and not a candidate for intervention  rec repeat cxr in 3 months, consider repeat swallow eval

## 2012-09-08 NOTE — Progress Notes (Signed)
Subjective:    Patient ID: Jacob Boyd, male    DOB: 05/27/1927  MRN: [id number]  Brief patient profile:  64 yowm never smoker never resp problems eval by Love for OSA with low noct 02 sats.   HPI 06/16/2012 1st pulmonary eval cc daytime drowsiness x sev years wakes variably rested with hypersomnia if unstimulated after bfast, variably feels rested in am but not aware of any problems while sleeping with sob. Very limited by Parkinson's dz but no limiting sob.   Despite difficulty with swallowing no h/o asp pna or exp to meds that cause PF. rec ono RA> did not qualify for 02 CXR > vague bilateral densities  09/08/2012 f/u ov/Frieda Arnall  Chief Complaint  Patient presents with  . Followup with cxr    Pt states breathing is unchanged since his last visit. He denies any new co's today.   intermittently chokes on food, last swallowing eval sev years ago per pt's wife. No def aspiration event recently nor sign cough/ coughing up food or discolored mucus. Not limited by doe as much as by geriatric decline/ PD  No obvious variabilty in symptoms, no cp or chest tightness, subjective wheeze overt sinus or hb symptoms. No unusual exp hx or h/o childhood pna/ asthma or premature birth to his knowledge.   Sleeping ok without nocturnal  or early am exacerbation  of respiratory  c/o's or need for noct saba. Also denies any obvious fluctuation of symptoms with weather or environmental changes or other aggravating or alleviating factors except as outlined above  Current Medications, Allergies, Past Medical History, Past Surgical History, Family History, and Social History were reviewed in [employer] record.  ROS  The following are not active complaints unless bolded sore throat, dysphagia, dental problems, itching, sneezing,  nasal congestion or excess/ purulent secretions, ear ache,   fever, chills, sweats, unintended wt loss, pleuritic or exertional cp, hemoptysis,  orthopnea pnd or leg  swelling, presyncope, palpitations, heartburn, abdominal pain, anorexia, nausea, vomiting, diarrhea  or change in bowel or urinary habits, change in stools or urine, dysuria,hematuria,  rash, arthralgias, visual complaints, headache, numbness weakness or ataxia or problems with walking or coordination,  change in mood/affect or memory.            Objective:   Physical Exam  09/08/2012         179    06/16/12 181 lb (82.101 kg)  05/10/12 182 lb (82.555 kg)  05/02/12 182 lb (82.555 kg)    Frail elderly wm nad with nl vital signs barely able to climb on table with one person assist  HEENT: nl dentition, turbinates, and orophanx. Nl external ear canals without cough reflex   NECK :  without JVD/Nodes/TM/ nl carotid upstrokes bilaterally   LUNGS: no acc muscle use, clear to A and P bilaterally without cough on insp or exp maneuvers   CV:  RRR  no s3 or murmur or increase in P2, no edema   ABD:  soft and nontender with nl excursion in the supine position. No bruits or organomegaly, bowel sounds nl  MS:  warm without deformities, calf tenderness, cyanosis or clubbing  SKIN: warm and dry without lesions    NEURO: inconsistent answers to questions, no deficits, resting tremor    CXR  09/08/2012 :  2.6 by 2.1 cm mass in the left upper lobe medially, well seen only  on the frontal view. Advise noncontrast enhanced chest CT to  further assess. There is scarring  in the right base. No mass seen  in the right base.  There is atherosclerotic change in the aorta.       Assessment & Plan:

## 2012-09-08 NOTE — Patient Instructions (Addendum)
Be careful with swallowing and Dr Clelia Croft may want at some point to repeat your swallowing evaluation    Please schedule a follow up cxr in 3 months within [facility], we can do that here at his office or Dr Clelia Croft can order it

## 2012-09-08 NOTE — Assessment & Plan Note (Signed)
-   03/31/12 Polysmonography neg x 02 sat 84%  - ONO RA 06/29/12 > did not meet criteria for 02    Nothing further to offer at this point

## 2012-09-09 NOTE — Telephone Encounter (Signed)
This pt is seen by Dr. Frances Furbish for sleep.   Needs 6 mo f/u.

## 2012-10-07 ENCOUNTER — Ambulatory Visit (INDEPENDENT_AMBULATORY_CARE_PROVIDER_SITE_OTHER): Payer: Medicare Other | Admitting: Neurology

## 2012-10-07 ENCOUNTER — Encounter: Payer: Self-pay | Admitting: Neurology

## 2012-10-07 VITALS — BP 120/70 | HR 80 | Temp 98.0°F | Resp 16 | Ht 70.0 in | Wt 177.4 lb

## 2012-10-07 DIAGNOSIS — G20 Parkinson's disease: Secondary | ICD-10-CM

## 2012-10-07 DIAGNOSIS — G251 Drug-induced tremor: Secondary | ICD-10-CM

## 2012-10-07 DIAGNOSIS — G250 Essential tremor: Secondary | ICD-10-CM

## 2012-10-07 DIAGNOSIS — G20A1 Parkinson's disease without dyskinesia, without mention of fluctuations: Secondary | ICD-10-CM

## 2012-10-07 DIAGNOSIS — G4752 REM sleep behavior disorder: Secondary | ICD-10-CM

## 2012-10-07 NOTE — Progress Notes (Signed)
Jacob Boyd was seen today in the movement disorders clinic for neurologic f/u.    The patients wife states he was dx with ET when the patient was about 79 years old.  He was not put on medication for this initially, but was later put on inderal and had SE with that.  They are unusure of when the dx of PD came in but it has been over 10 years. They do not know the first symptom. They do not know when levodopa was added and are unsure if it is helpful.  He is on carbidopa/levodopa 25/250 three to four times per day but is very noncompliant.  He administers his own medication.  He initially told me that he takes his levodopa this morning, but it turns out he has not yet taken it today.  He has been on lithium since the 1960's.  He is on it for bipolar d/o.  His wife relates that he was told by Dr. Sandria Manly that his parkinsonism is due to lithium, but the psychiatrist told him that it was not.  He has been on the atypical antipsychotic medications in the past, but has been off of those for many years.   10/07/2012  The patients previous myoclonic jerks have virtually gone away with the decrease in the Neurontin.  Overall, the patient states that he feels much better.  His wife reports that he has been doing much better.  He has been walking and exercising with her faithfully.  He has been more faithful with his levodopa.  He is currently taking carbidopa/levodopa 25/100, 2 tablets 5 times per day.  He remains on the Exelon patch.  His memory has been stable.  His clonazepam has definitely helped him sleep more restfully.  I reviewed his pulmonary notes since last visit, which indicate that the patient was complaining of difficulty swallowing.  The patient and his wife both deny this today.  His wife states that he has not had any swallowing difficulty for 3 years.  His wife has asked about the results of his chest x-ray.  Wearing off:  no  How long before next dose:  n/a Falls:   yes but no serious injuries  since last visit. N/V:  no Hallucinations:  no  visual distortions: no Lightheaded:  no  Syncope: no Dyskinesia:  no    PREVIOUS MEDICATIONS: Seroquel, risperdol  ALLERGIES:   Allergies  Allergen Reactions  . Penicillins Rash    CURRENT MEDICATIONS:  Current Outpatient Prescriptions on File Prior to Visit  Medication Sig Dispense Refill  . acetic acid-hydrocortisone (VOSOL-HC) otic solution As needed      . aspirin 81 MG tablet Take 81 mg by mouth daily.        . carbidopa-levodopa (SINEMET IR) 25-100 MG per tablet 2 tabs five times a day.  300 tablet  3  . Cholecalciferol (VITAMIN D PO) Take 50,000 Units by mouth once a week.       . cholecalciferol (VITAMIN D) 1000 UNITS tablet Take 1,000 Units by mouth daily.      . clonazePAM (KLONOPIN) 0.5 MG tablet 1/2-1 qhs  30 tablet  3  . desonide (DESOWEN) 0.05 % lotion As needed      . gabapentin (NEURONTIN) 300 MG capsule Take 300 mg by mouth 2 (two) times daily.       Marland Kitchen lithium 300 MG capsule Take 300 mg by mouth 1 day or 1 dose.        . metoprolol  tartrate (LOPRESSOR) 25 MG tablet Take 25 mg by mouth 2 (two) times daily.        . Oxcarbazepine (TRILEPTAL) 300 MG tablet Take 300 mg by mouth 4 (four) times daily.       . Rivastigmine 13.3 MG/24HR PT24 Place 1 patch (13.3 mg total) onto the skin 1 day or 1 dose.  30 patch  5   No current facility-administered medications on file prior to visit.    PAST MEDICAL HISTORY:   Past Medical History  Diagnosis Date  . Prostate cancer     with radiation  . Bipolar 1 disorder   . Coronary artery disease   . Atrial fibrillation     persistent, with atypical atrial flutter  . Tachycardia-bradycardia     s/p PPM by Dr Reyes Ivan, Gen change by Fawn Kirk 3/12  . Parkinson's disease   . Hypertension   . AAA (abdominal aortic aneurysm)     medical management  . Dementia     mild  . DVT (deep venous thrombosis)   . Hypoxemia 05/10/2012  . Sleep disorder 05/10/2012    PAST SURGICAL HISTORY:    Past Surgical History  Procedure Laterality Date  . Nasal sinus surgery    . Pacemaker insertion  04/08/2010    Initial pacemaker by Dr Reyes Ivan, gen change by Fawn Kirk (MDT) 3/12  . Cataract extraction w/ intraocular lens  implant, bilateral      SOCIAL HISTORY:   History   Social History  . Marital Status: Married    Spouse Name: N/A    Number of Children: N/A  . Years of Education: N/A   Occupational History  . Not on file.   Social History Main Topics  . Smoking status: Never Smoker   . Smokeless tobacco: Never Used  . Alcohol Use: No  . Drug Use: No  . Sexual Activity: Not on file   Other Topics Concern  . Not on file   Social History Narrative  . No narrative on file    FAMILY HISTORY:   Family Status  Relation Status Death Age  . Mother Deceased 31    old age  . Father Deceased 4    Heart disease  . Sister Alive     dementia  . Brother Deceased     4, MI, MVA, ?NPH, trauma  . Child Alive   . Child Alive   . Child Alive     ROS:  A complete 10 system review of systems was obtained and was unremarkable apart from what is mentioned above.  PHYSICAL EXAMINATION:    VITALS:   Filed Vitals:   10/07/12 1041  BP: 120/70  Pulse: 80  Temp: 98 F (36.7 C)  Resp: 16  Height: 5\' 10"  (1.778 m)  Weight: 177 lb 6.4 oz (80.468 kg)    GEN:  The patient appears stated age and is in NAD. HEENT:  Normocephalic, atraumatic.  The mucous membranes are moist. The superficial temporal arteries are without ropiness or tenderness. CV:  RRR Lungs:  CTAB Neck/HEME:  There are no carotid bruits bilaterally.  Neurological examination:  Orientation: The patient scored a 14/30 on his MoCA previously.  Today, he is more alert and is more interactive. Cranial nerves: There is good facial symmetry. Pupils are equal round and reactive to light bilaterally. Fundoscopic exam reveals clear margins bilaterally. Extraocular muscles are intact. The visual fields are full to  confrontational testing. The speech is fluent and clear. Soft palate rises symmetrically and  there is no tongue deviation. Hearing is decreased to conversational tone. Sensation: Sensation is intact to light and pinprick throughout (facial, trunk, extremities). Vibration is intact at the bilateral big toe. There is no extinction with double simultaneous stimulation. There is no sensory dermatomal level identified. Motor: Strength is 5/5 in the bilateral upper and lower extremities.   Shoulder shrug is equal and symmetric.  There is no pronator drift. Deep tendon reflexes: Deep tendon reflexes are 3/4 at the bilateral biceps, triceps, brachioradialis, patella and absent at the bilateral achilles. Plantar responses are downgoing bilaterally.  Movement examination: Tone: There is normal tone bilaterally today.  The tone in the legs is normal. Abnormal movements: There is both a minimal resting tremor and a mild postural tremor today..  Coordination:  There is definite decremation with RAM's, seen more prominently in the left hemibody than the right, but seen with all forms including alternating supination and pronation of the forearm, hand opening and closing, finger taps, heel taps and toe taps. Gait and Station: The patient is able to walk markedly better than previous visits.  He had a mild festinating gait.  There was no shuffling.  There was no freezing.  LABS:    Chemistry      Component Value Date/Time   NA 137 06/15/2012 1149   K 5.0 06/15/2012 1149   CL 104 06/15/2012 1149   CO2 27 06/15/2012 1149   BUN 29* 06/15/2012 1149   CREATININE 1.7* 06/15/2012 1149      Component Value Date/Time   CALCIUM 9.4 06/15/2012 1149   ALKPHOS 90 06/15/2012 1149   AST 17 06/15/2012 1149   ALT 8 06/15/2012 1149   BILITOT 0.6 06/15/2012 1149       ASSESSMENT/PLAN:  1.  Parkinsonism.  I suspect that this does represent idiopathic Parkinson's disease.  I reassured them that lithium is not the cause of the  parkinsonism, but it certainly is contributing to tremor.  We talked about the fact that lithium does not cause parkinsonism, although some of the other atypical antipsychotic that he has been on certainly could.  Nonetheless, tardive parkinsonism is extremely rare and I do not suspect this.  I suspect that when he was diagnosed with essential tremor years ago, that he had a lithium-induced tremor and not essential tremor.  -I am going to keep him on the levodopa, and he will continue with carbidopa/levodopa 25/100  2 tablets 5 times a day.  Risks, benefits, side effects and alternative therapies were discussed.  The opportunity to ask questions was given and they were answered to the best of my ability.  The patient expressed understanding and willingness to follow the outlined treatment protocols. 2.  Myoclonus.  -This was due to the Neurontin in the setting of renal insufficiency.  Decreasing the Neurontin dose has helped. 3.  nocturnal hypoxemia.  -His sleep study showed no evidence of sleep apnea, but did show oxygen saturations in the low 80s.  He had a pulmonology consult, and there were no additional recommendations. 4.  Memory loss, likely related to parkinsonism.  - he will continue with Exelon patch, 13.3 mg.  -We talked about the importance of both physical and mental exercises relates to memory. 5.  Bipolar disorder.  -He is under the care of a psychiatrist.  He has failed and had significant side effects to the atypical antipsychotics, particularly Seroquel. 6.  REM behavior disorder.  -I am going to continue the clonazepam, which has helped.  -I discussed with  the patient and his wife that I do not want them blocking the door that he cannot get out.  This is a Air cabin crew hazard. 7.  abnormal chest x-ray.  -I did email his pulmonologist, at his wife's request.  He does have evidence of a mass on his August chest x-ray, which was also present in May.  I do not know if this is changed and asked  his pulmonologist to give [place] some insight in this regard.  I explained to the patient and his wife that this is out of my field of expertise. 8.  I will see him back after the new year, sooner should new neurologic issues arise.

## 2012-10-07 NOTE — Patient Instructions (Addendum)
Follow up in mid January.

## 2012-10-10 ENCOUNTER — Encounter: Payer: Self-pay | Admitting: Cardiology

## 2012-10-10 ENCOUNTER — Other Ambulatory Visit (INDEPENDENT_AMBULATORY_CARE_PROVIDER_SITE_OTHER): Payer: Medicare Other

## 2012-10-10 ENCOUNTER — Ambulatory Visit (INDEPENDENT_AMBULATORY_CARE_PROVIDER_SITE_OTHER): Payer: Medicare Other | Admitting: Cardiology

## 2012-10-10 VITALS — BP 140/78 | HR 74 | Ht 70.0 in | Wt 174.0 lb

## 2012-10-10 DIAGNOSIS — G20A1 Parkinson's disease without dyskinesia, without mention of fluctuations: Secondary | ICD-10-CM

## 2012-10-10 DIAGNOSIS — G20 Parkinson's disease: Secondary | ICD-10-CM

## 2012-10-10 DIAGNOSIS — I10 Essential (primary) hypertension: Secondary | ICD-10-CM

## 2012-10-10 DIAGNOSIS — E7800 Pure hypercholesterolemia, unspecified: Secondary | ICD-10-CM

## 2012-10-10 DIAGNOSIS — I4891 Unspecified atrial fibrillation: Secondary | ICD-10-CM

## 2012-10-10 LAB — HEPATIC FUNCTION PANEL
Alkaline Phosphatase: 63 U/L (ref 39–117)
Bilirubin, Direct: 0 mg/dL (ref 0.0–0.3)
Total Protein: 6.9 g/dL (ref 6.0–8.3)

## 2012-10-10 LAB — BASIC METABOLIC PANEL
BUN: 35 mg/dL — ABNORMAL HIGH (ref 6–23)
CO2: 26 mEq/L (ref 19–32)
Chloride: 107 mEq/L (ref 96–112)
Potassium: 4.7 mEq/L (ref 3.5–5.1)

## 2012-10-10 LAB — LDL CHOLESTEROL, DIRECT: Direct LDL: 170.7 mg/dL

## 2012-10-10 LAB — LIPID PANEL
HDL: 54.4 mg/dL (ref >39.00)
Total CHOL/HDL Ratio: 4
VLDL: 24.2 mg/dL (ref 0.0–40.0)

## 2012-10-10 NOTE — Assessment & Plan Note (Signed)
The patient is not having any symptoms from his blood pressure.  No dizziness or syncope.  No headaches.  No symptoms of CHF

## 2012-10-10 NOTE — Patient Instructions (Addendum)
Will obtain labs today and call you with the results ([email or web address])  Your physician recommends that you continue on your current medications as directed. Please refer to the Current Medication list given to you today.  Your physician wants you to follow-up in: 4 months OV/EKG You will receive a reminder letter in the mail two months in advance. If you don't receive a letter, please call our office to schedule the follow-up appointment.

## 2012-10-10 NOTE — Progress Notes (Signed)
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 He has a past history of tachybradycardia syndrome and underlying atrial fibrillation. He has a history of hypercholesterolemia. He is also had Parkinson's disease and depression. We saw him as a work in on 04/10/11 after he had a spell while sitting in a coffee shop with a friend. He became temporarily confused and was unable to speak clearly the friend drove the patient home and by the time he got home his symptoms have cleared. These have not recurred. His pacemaker was interrogated when last seen on 04/10/11 and showed normal pacemaker function. The patient had an echocardiogram 04/16/11 showing normal ejection fraction of 55-65% and mild mitral regurgitation and no evidence of any thrombi. The patient had a carotid Doppler in March 2013 which showed nonobstructive disease.   Current Outpatient Prescriptions  Medication Sig Dispense Refill  . acetic acid-hydrocortisone (VOSOL-HC) otic solution As needed      . aspirin 81 MG tablet Take 81 mg by mouth daily.        . carbidopa-levodopa (SINEMET IR) 25-100 MG per tablet 2 tabs five times a day.  300 tablet  3  . Cholecalciferol (VITAMIN D PO) Take 50,000 Units by mouth once a week.       . cholecalciferol (VITAMIN D) 1000 UNITS tablet Take 1,000 Units by mouth daily.      . clonazePAM (KLONOPIN) 0.5 MG tablet 1/2-1 qhs  30 tablet  3  . desonide (DESOWEN) 0.05 % lotion As needed      . gabapentin (NEURONTIN) 300 MG capsule Take 300 mg by mouth 2 (two) times daily.       Marland Kitchen lithium 300 MG capsule Take 300 mg by mouth 1 day or 1 dose.        . metoprolol tartrate (LOPRESSOR) 25 MG tablet Take 25 mg by mouth 2 (two) times daily.        . Oxcarbazepine (TRILEPTAL)  300 MG tablet Take 300 mg by mouth 4 (four) times daily.       . Rivastigmine 13.3 MG/24HR PT24 Place 1 patch (13.3 mg total) onto the skin 1 day or 1 dose.  30 patch  5   No current facility-administered medications for this visit.    Allergies  Allergen Reactions  . Penicillins Rash    Patient Active Problem List   Diagnosis Date Noted  . Atrial fibrillation 03/26/2010    Priority: High  . Symptomatic bradycardia 03/27/2010    Priority: Medium  . Parkinson's disease 07/04/2012  . Unspecified hereditary and idiopathic peripheral neuropathy 07/04/2012  . REM behavioral disorder 07/04/2012  . Pulmonary nodules 06/17/2012  . Myoclonus 06/15/2012  . Renal insufficiency 06/15/2012  . Nocturnal hypoxemia 05/10/2012  . Sleep disorder 05/10/2012  . Parkinson disease 05/02/2012  . Tremor due to lithium 05/02/2012  . Abdominal aneurysm without mention of rupture 03/15/2012  . ESSENTIAL HYPERTENSION, BENIGN 03/27/2010  . HYPERCHOLESTEROLEMIA 03/26/2010  . BIPOLAR AFFECTIVE DISORDER 03/26/2010  . PARKINSON'S DISEASE 03/26/2010  . PRIMARY PULMONARY HYPERTENSION 03/26/2010  . AORTIC VALVE SCLEROSIS 03/26/2010  . SLEEP APNEA 03/26/2010  . PERSONAL HISTORY MALIGNANT NEOPLASM PROSTATE 03/26/2010    History  Smoking status  . Never Smoker   Smokeless tobacco  . Never  Used    History  Alcohol Use No    Family History  Problem Relation Age of Onset  . Heart attack Mother   . Heart disease Father   . Heart disease Brother     Heart Disease before age 69  . Heart attack Brother     Review of Systems: Constitutional: no fever chills diaphoresis or fatigue or change in weight.  Head and neck: no hearing loss, no epistaxis, no photophobia or visual disturbance. Respiratory: No cough, shortness of breath or wheezing. Cardiovascular: No chest pain peripheral edema, palpitations. Gastrointestinal: No abdominal distention, no abdominal pain, no change in bowel habits hematochezia  or melena. Genitourinary: No dysuria, no frequency, no urgency, no nocturia. Musculoskeletal:No arthralgias, no back pain, no gait disturbance or myalgias. Neurological: No dizziness, no headaches, no numbness, no seizures, no syncope, no weakness, no tremors. Hematologic: No lymphadenopathy, no easy bruising. Psychiatric: No confusion, no hallucinations, no sleep disturbance.    Physical Exam: Filed Vitals:   10/10/12 0938  BP: 140/78  Pulse: 74   the general appearance reveals a well-developed well-nourished elderly gentleman who is alert.  Denies any cardiac complaints today.The head and neck exam reveals pupils equal and reactive.  Extraocular movements are full.  There is no scleral icterus.  The mouth and pharynx are normal.  The neck is supple.  The carotids reveal no bruits.  The jugular venous pressure is normal.  The  thyroid is not enlarged.  There is no lymphadenopathy.  The chest is clear to percussion and auscultation.  There are no rales or rhonchi.  Expansion of the chest is symmetrical.  The precordium is quiet.  The first heart sound is normal.  The second heart sound is physiologically split.  There is no murmur gallop rub or click.  There is no abnormal lift or heave.  The abdomen is soft and nontender.  The bowel sounds are normal.  The liver and spleen are not enlarged.  There are no abdominal masses.  There are no abdominal bruits.  Extremities reveal good pedal pulses.  There is no phlebitis or edema.  There is no cyanosis or clubbing.  Strength is normal and symmetrical in all extremities.  There is no lateralizing weakness.  There are no sensory deficits.  The skin is warm and dry.  There is no rash.     Assessment / Plan: Continue on same medication.  Recheck here in 4 months for office visit and EKG

## 2012-10-10 NOTE — Assessment & Plan Note (Signed)
The patient's parkinsonism is doing better since Dr. Arbutus Leas changed his medication.  He is now on immediate release carbidopa and is on less Neurontin and is doing better.  He has not had any recent falls

## 2012-10-10 NOTE — Assessment & Plan Note (Signed)
Because of frequent falls the patient is not on Coumadin any longer.  He is taking a baby aspirin daily.  He has not had any TIA or stroke symptoms.

## 2012-10-18 ENCOUNTER — Telehealth: Payer: Self-pay | Admitting: Cardiology

## 2012-10-18 NOTE — Telephone Encounter (Signed)
New problem  Jacob Boyd, Mrs Chesterfield calling you back with some information.

## 2012-10-19 NOTE — Telephone Encounter (Signed)
FYI Dr Clelia Croft started patient on Eliquis and Crestor 10 mg 1/2 tablet daily. Will forward to  Dr. Patty Sermons so he will be aware

## 2012-10-19 NOTE — Telephone Encounter (Signed)
okay

## 2012-11-07 ENCOUNTER — Ambulatory Visit (INDEPENDENT_AMBULATORY_CARE_PROVIDER_SITE_OTHER): Payer: Medicare Other | Admitting: *Deleted

## 2012-11-07 DIAGNOSIS — I4891 Unspecified atrial fibrillation: Secondary | ICD-10-CM

## 2012-11-11 LAB — REMOTE PACEMAKER DEVICE
AL THRESHOLD: 0.75 V
RV LEAD THRESHOLD: 0.75 V
VENTRICULAR PACING PM: 100

## 2012-11-14 ENCOUNTER — Ambulatory Visit: Payer: Medicare Other | Admitting: Neurology

## 2012-11-16 ENCOUNTER — Telehealth: Payer: Self-pay

## 2012-11-16 NOTE — Telephone Encounter (Signed)
Go ahead and refill x 6 months 

## 2012-11-16 NOTE — Telephone Encounter (Signed)
Carbidopa/levodopa refill request.

## 2012-11-17 MED ORDER — CARBIDOPA-LEVODOPA 25-100 MG PO TABS: ORAL_TABLET | ORAL | Status: DC

## 2012-11-17 NOTE — Telephone Encounter (Signed)
Refill sent to pharmacy.   

## 2012-11-28 ENCOUNTER — Encounter: Payer: Self-pay | Admitting: Internal Medicine

## 2012-12-01 ENCOUNTER — Encounter: Payer: Self-pay | Admitting: Internal Medicine

## 2012-12-08 ENCOUNTER — Other Ambulatory Visit: Payer: Self-pay

## 2013-02-10 ENCOUNTER — Ambulatory Visit (INDEPENDENT_AMBULATORY_CARE_PROVIDER_SITE_OTHER): Payer: Medicare Other | Admitting: Neurology

## 2013-02-10 ENCOUNTER — Encounter: Payer: Self-pay | Admitting: Neurology

## 2013-02-10 VITALS — BP 140/80 | HR 90 | Temp 97.5°F | Resp 16 | Ht 70.0 in | Wt 183.6 lb

## 2013-02-10 DIAGNOSIS — G251 Drug-induced tremor: Secondary | ICD-10-CM

## 2013-02-10 DIAGNOSIS — G252 Other specified forms of tremor: Secondary | ICD-10-CM

## 2013-02-10 DIAGNOSIS — T43505A Adverse effect of unspecified antipsychotics and neuroleptics, initial encounter: Secondary | ICD-10-CM

## 2013-02-10 DIAGNOSIS — G4752 REM sleep behavior disorder: Secondary | ICD-10-CM

## 2013-02-10 DIAGNOSIS — G250 Essential tremor: Secondary | ICD-10-CM

## 2013-02-10 DIAGNOSIS — F0280 Dementia in other diseases classified elsewhere without behavioral disturbance: Secondary | ICD-10-CM

## 2013-02-10 DIAGNOSIS — G20 Parkinson's disease: Secondary | ICD-10-CM

## 2013-02-10 DIAGNOSIS — T43591A Poisoning by other antipsychotics and neuroleptics, accidental (unintentional), initial encounter: Secondary | ICD-10-CM

## 2013-02-10 DIAGNOSIS — G20A1 Parkinson's disease without dyskinesia, without mention of fluctuations: Secondary | ICD-10-CM | POA: Insufficient documentation

## 2013-02-10 NOTE — Progress Notes (Signed)
Jacob Boyd was seen today in the movement disorders clinic for neurologic f/u.    The patients wife states he was dx with ET when the patient was about 80 years old.  He was not put on medication for this initially, but was later put on inderal and had SE with that.  They are unusure of when the dx of PD came in but it has been over 10 years. They do not know the first symptom. They do not know when levodopa was added and are unsure if it is helpful.  He is on carbidopa/levodopa 25/250 three to four times per day but is very noncompliant.  He administers his own medication.  He initially told me that he takes his levodopa this morning, but it turns out he has not yet taken it today.  He has been on lithium since the 1960's.  He is on it for bipolar d/o.  His wife relates that he was told by Dr. Erling Cruz that his parkinsonism is due to lithium, but the psychiatrist told him that it was not.  He has been on the atypical antipsychotic medications in the past, but has been off of those for many years.   10/07/12:   The patients previous myoclonic jerks have virtually gone away with the decrease in the Neurontin.  Overall, the patient states that he feels much better.  His wife reports that he has been doing much better.  He has been walking and exercising with her faithfully.  He has been more faithful with his levodopa.  He is currently taking carbidopa/levodopa 25/100, 2 tablets 5 times per day.  He remains on the Exelon patch.  His memory has been stable.  His clonazepam has definitely helped him sleep more restfully.  I reviewed his pulmonary notes since last visit, which indicate that the patient was complaining of difficulty swallowing.  The patient and his wife both deny this today.  His wife states that he has not had any swallowing difficulty for 3 years.  His wife has asked about the results of his chest x-ray.  02/10/13:  The patient presents today with his wife who supplements the history.  The patient  has Parkinson's disease and is currently on carbidopa/levodopa 25/100, 2 tablets 5 times per day.  He is on lithium, which contributes significantly to tremor.  He is on clonazepam for REM behavior disorder and is doing well with that medication in terms of no SE but he is still kicking and jerking at night.  His wife thinks that it may make him sleepy during the day but is unsure because he doesn't do much activity during the day either.  He is on the Exelon patch for memory changes.  He has had no site reactions.  Wearing off:  no  How long before next dose:  n/a Falls:   no N/V:  no Hallucinations:  no  visual distortions: no Lightheaded:  no  Syncope: no Dyskinesia:  no    PREVIOUS MEDICATIONS: Seroquel, risperdol  ALLERGIES:   Allergies  Allergen Reactions  . Penicillins Rash    CURRENT MEDICATIONS:  Current Outpatient Prescriptions on File Prior to Visit  Medication Sig Dispense Refill  . acetic acid-hydrocortisone (VOSOL-HC) otic solution As needed      . Apixaban (ELIQUIS PO) Take by mouth.      Marland Kitchen aspirin 81 MG tablet Take 81 mg by mouth daily.        . carbidopa-levodopa (SINEMET IR) 25-100 MG per tablet  2 tabs five times a day.  300 tablet  6  . Cholecalciferol (VITAMIN D PO) Take 50,000 Units by mouth once a week.       . cholecalciferol (VITAMIN D) 1000 UNITS tablet Take 1,000 Units by mouth daily.      . clonazePAM (KLONOPIN) 0.5 MG tablet 1/2-1 qhs  30 tablet  3  . desonide (DESOWEN) 0.05 % lotion As needed      . gabapentin (NEURONTIN) 300 MG capsule Take 300 mg by mouth 2 (two) times daily.       Marland Kitchen lithium 300 MG capsule Take 300 mg by mouth 1 day or 1 dose.        . metoprolol tartrate (LOPRESSOR) 25 MG tablet Take 25 mg by mouth 2 (two) times daily.        . Oxcarbazepine (TRILEPTAL) 300 MG tablet Take 300 mg by mouth 4 (four) times daily.       . Rivastigmine 13.3 MG/24HR PT24 Place 1 patch (13.3 mg total) onto the skin 1 day or 1 dose.  30 patch  5  .  rosuvastatin (CRESTOR) 10 MG tablet Take 10 mg by mouth as directed. 1/2 tablet daily       No current facility-administered medications on file prior to visit.    PAST MEDICAL HISTORY:   Past Medical History  Diagnosis Date  . Prostate cancer     with radiation  . Bipolar 1 disorder   . Coronary artery disease   . Atrial fibrillation     persistent, with atypical atrial flutter  . Tachycardia-bradycardia     s/p PPM by Dr Verlon Setting, Gen change by Greggory Brandy 3/12  . Parkinson's disease   . Hypertension   . AAA (abdominal aortic aneurysm)     medical management  . Dementia     mild  . DVT (deep venous thrombosis)   . Hypoxemia 05/10/2012  . Sleep disorder 05/10/2012    PAST SURGICAL HISTORY:   Past Surgical History  Procedure Laterality Date  . Nasal sinus surgery    . Pacemaker insertion  04/08/2010    Initial pacemaker by Dr Verlon Setting, gen change by Greggory Brandy (MDT) 3/12  . Cataract extraction w/ intraocular lens  implant, bilateral      SOCIAL HISTORY:   History   Social History  . Marital Status: Married    Spouse Name: N/A    Number of Children: N/A  . Years of Education: N/A   Occupational History  . Not on file.   Social History Main Topics  . Smoking status: Never Smoker   . Smokeless tobacco: Never Used  . Alcohol Use: No  . Drug Use: No  . Sexual Activity: Not on file   Other Topics Concern  . Not on file   Social History Narrative  . No narrative on file    FAMILY HISTORY:   Family Status  Relation Status Death Age  . Mother Deceased 29    old age  . Father Deceased 21    Heart disease  . Sister Alive     dementia  . Brother Deceased     4, MI, MVA, ?NPH, trauma  . Child Alive   . Child Alive   . Child Alive     ROS:  A complete 10 system review of systems was obtained and was unremarkable apart from what is mentioned above.  PHYSICAL EXAMINATION:    VITALS:   Filed Vitals:   02/10/13 0903  BP: 140/80  Pulse: 90  Temp: 97.5 F (36.4 C)  Resp: 16   Height: 5\' 10"  (1.778 m)  Weight: 183 lb 9.6 oz (83.28 kg)    GEN:  The patient appears stated age and is in NAD. HEENT:  Normocephalic, atraumatic.  The mucous membranes are moist. The superficial temporal arteries are without ropiness or tenderness. CV:  RRR Lungs:  CTAB Neck/HEME:  There are no carotid bruits bilaterally.  Neurological examination:  Orientation: The patient scored a 14/30 on his MoCA previously.  Today, he is alert and is interactive.   Cranial nerves: There is good facial symmetry. Pupils are equal round and reactive to light bilaterally. Fundoscopic exam reveals clear margins bilaterally. Extraocular muscles are intact. The visual fields are full to confrontational testing. The speech is fluent and clear. Soft palate rises symmetrically and there is no tongue deviation. Hearing is decreased to conversational tone. Sensation: Sensation is intact to light and pinprick throughout (facial, trunk, extremities). Vibration is intact at the bilateral big toe. There is no extinction with double simultaneous stimulation. There is no sensory dermatomal level identified. Motor: Strength is 5/5 in the bilateral upper and lower extremities.   Shoulder shrug is equal and symmetric.  There is no pronator drift. Deep tendon reflexes: Deep tendon reflexes are 3/4 at the bilateral biceps, triceps, brachioradialis, patella and absent at the bilateral achilles. Plantar responses are downgoing bilaterally.  Movement examination: Tone: There is mild UE rigidity, R greater than L.   Abnormal movements: There is both a minimal resting tremor and a mild postural tremor today..  Coordination:  There is definite decremation with RAM's, seen more prominently in the left hemibody than the right, but seen with all forms including alternating supination and pronation of the forearm, hand opening and closing, finger taps, heel taps and toe taps. Gait and Station: The patient was shuffling more  today.  LABS:    Chemistry      Component Value Date/Time   NA 139 10/10/2012 1015   K 4.7 10/10/2012 1015   CL 107 10/10/2012 1015   CO2 26 10/10/2012 1015   BUN 35* 10/10/2012 1015   CREATININE 1.5 10/10/2012 1015      Component Value Date/Time   CALCIUM 9.5 10/10/2012 1015   ALKPHOS 63 10/10/2012 1015   AST 14 10/10/2012 1015   ALT 7 10/10/2012 1015   BILITOT 0.5 10/10/2012 1015       ASSESSMENT/PLAN:  1.  Parkinsonism.  I suspect that this does represent idiopathic Parkinson's disease.  I reassured them that lithium is not the cause of the parkinsonism, but it certainly is contributing to tremor.  We talked about the fact that lithium does not cause parkinsonism, although some of the other atypical antipsychotic that he has been on certainly could.  Nonetheless, tardive parkinsonism is extremely rare and I do not suspect this.  I suspect that when he was diagnosed with essential tremor years ago, that he had a lithium-induced tremor and not essential tremor.  -I am going to keep him on the levodopa, and he will continue with carbidopa/levodopa 25/100  2 tablets 5 times a day (really only taking it 3-5 times per day as sleeps during much of day).  Risks, benefits, side effects and alternative therapies were discussed.  The opportunity to ask questions was given and they were answered to the best of my ability.  The patient expressed understanding and willingness to follow the outlined treatment protocols.  -I will send [facility] to his  home for PT.  -He asked about the Parkinson's disease exercise class.  I really do not think he is a candidate because he cannot get off of the floor, but we will send him to see if he is. 2.  Myoclonus.  -This was due to the Neurontin in the setting of renal insufficiency.  Decreasing the Neurontin dose has helped. 3.  nocturnal hypoxemia.  -His sleep study showed no evidence of sleep apnea, but did show oxygen saturations in the low 80s.  He had a pulmonology consult,  and there were no additional recommendations. 4.  Memory loss, likely related to parkinsonism.  - he will continue with Exelon patch, 13.3 mg.  -We talked about the importance of both physical and mental exercises relates to memory. 5.  Bipolar disorder.  -He is under the care of a psychiatrist.  He has failed and had significant side effects to the atypical antipsychotics, particularly Seroquel. 6.  REM behavior disorder.  -I am going to continue the clonazepam, which initially helped but he is still having some kicking and acting out of the dream this.  I was going to increase it, but his wife wasn't sure if it was making him too sleepy.  In the end, we actually decided to hold it for a few days and then if he is still sleepy, we may increase it (as this was obviously not a cause).  I think that much of the cause of his daytime hypersomnolence is lack of activity and also day/night reversal is an issue.  -I discussed with the patient and his wife that I do not want them blocking the door that he cannot get out.  This is a Estate agent hazard. 7.  abnormal chest x-ray.  -I did email his pulmonologist, at his wife's request.  He does have evidence of a mass on his August chest x-ray, which was also present in May.  Dr. Melvyn Novas felt that this was not neoplasm but rather evidence of aspiration. 8.  I will see him back after the new year, sooner should new neurologic issues arise.

## 2013-02-10 NOTE — Patient Instructions (Addendum)
1.  Hold klonopin and let me know how you do 2.  I will send the physical therapy to your home 3.  I will have you evaluated for the parkinsons exercise class as we talked about

## 2013-02-13 ENCOUNTER — Ambulatory Visit (INDEPENDENT_AMBULATORY_CARE_PROVIDER_SITE_OTHER): Payer: Medicare Other | Admitting: *Deleted

## 2013-02-13 DIAGNOSIS — I4891 Unspecified atrial fibrillation: Secondary | ICD-10-CM

## 2013-02-14 ENCOUNTER — Telehealth: Payer: Self-pay

## 2013-02-14 LAB — MDC_IDC_ENUM_SESS_TYPE_REMOTE
Battery Remaining Longevity: 103 mo
Brady Statistic AP VP Percent: 100 %
Brady Statistic AS VP Percent: 0 %
Brady Statistic AS VS Percent: 0 %
Date Time Interrogation Session: 20150116021851
Lead Channel Impedance Value: 433 Ohm
Lead Channel Impedance Value: 526 Ohm
Lead Channel Pacing Threshold Amplitude: 0.75 V
Lead Channel Pacing Threshold Amplitude: 0.875 V
Lead Channel Pacing Threshold Pulse Width: 0.4 ms
Lead Channel Setting Pacing Amplitude: 2.5 V
MDC IDC MSMT BATTERY IMPEDANCE: 210 Ohm
MDC IDC MSMT BATTERY VOLTAGE: 2.79 V
MDC IDC MSMT LEADCHNL RV PACING THRESHOLD PULSEWIDTH: 0.4 ms
MDC IDC SET LEADCHNL RA PACING AMPLITUDE: 1.75 V
MDC IDC SET LEADCHNL RV PACING PULSEWIDTH: 0.4 ms
MDC IDC SET LEADCHNL RV SENSING SENSITIVITY: 2.8 mV
MDC IDC STAT BRADY AP VS PERCENT: 0 %

## 2013-02-14 NOTE — Telephone Encounter (Signed)
Called pt's wife and let her know about his appointment at [facility] on Tuesday January 27th at 1:15pm for an evaluation to take the PD exersice class.

## 2013-02-23 ENCOUNTER — Telehealth: Payer: Self-pay | Admitting: Neurology

## 2013-02-23 MED ORDER — CLONAZEPAM 0.5 MG PO TABS: ORAL_TABLET | ORAL | Status: DC

## 2013-02-23 NOTE — Telephone Encounter (Signed)
Klonopin .5 mg RX request received and faxed back to [facility] at [phone] with confirmation received. Okay for #30 tablets with 3 refills. Added to mediation list.

## 2013-02-24 ENCOUNTER — Telehealth: Payer: Self-pay | Admitting: Neurology

## 2013-02-24 NOTE — Telephone Encounter (Signed)
Amy with [facility] called for order to see patient for occupational therapy twice a week for two weeks and then once a week for 4 weeks. I gave a verbal okay and she will call with any questions.

## 2013-02-28 ENCOUNTER — Encounter: Payer: Self-pay | Admitting: *Deleted

## 2013-02-28 ENCOUNTER — Ambulatory Visit: Payer: Medicare Other | Admitting: Occupational Therapy

## 2013-02-28 ENCOUNTER — Ambulatory Visit: Payer: Medicare Other | Admitting: Physical Therapy

## 2013-03-03 ENCOUNTER — Encounter: Payer: Self-pay | Admitting: Internal Medicine

## 2013-03-08 ENCOUNTER — Telehealth: Payer: Self-pay | Admitting: Neurology

## 2013-03-08 NOTE — Telephone Encounter (Signed)
Called back and gave verbal okay to Amy at [facility].

## 2013-03-08 NOTE — Telephone Encounter (Signed)
[facility] needs order to extend OT. CB# [phone]. Please call w/ verbal / Sherri (2x per week, starting 03/19/13) / Sherri S.

## 2013-03-14 ENCOUNTER — Encounter: Payer: Self-pay | Admitting: Family

## 2013-03-15 ENCOUNTER — Ambulatory Visit (INDEPENDENT_AMBULATORY_CARE_PROVIDER_SITE_OTHER): Payer: Medicare Other | Admitting: Family

## 2013-03-15 ENCOUNTER — Encounter: Payer: Self-pay | Admitting: Family

## 2013-03-15 ENCOUNTER — Ambulatory Visit (HOSPITAL_COMMUNITY)
Admission: RE | Admit: 2013-03-15 | Discharge: 2013-03-15 | Disposition: A | Discharge status: Home / Self Care | Payer: Medicare Other | Source: Ambulatory Visit | Attending: Family | Admitting: Family

## 2013-03-15 VITALS — BP 131/83 | HR 67 | Resp 16 | Ht 70.0 in | Wt 182.0 lb

## 2013-03-15 DIAGNOSIS — I714 Abdominal aortic aneurysm, without rupture, unspecified: Secondary | ICD-10-CM

## 2013-03-15 NOTE — Progress Notes (Signed)
[facility] [place]  Established Abdominal Aortic Aneurysm  History of Present Illness  Jacob Boyd is a 80 y.o. (1927/02/23) male patient of Dr. Scot Dock followed for known AAA He presents with chief complaint: follow up for AAA.    The patient does not have back or abdominal pain.  The patient is not a smoker. The patient denies claudication in legs with walking, does have weakness. The patient denies history of stroke or TIA symptoms. He has Parkinson's Disease, does not walk much. Taking Elequis for atrial fib, has a pacemaker.  Pt Diabetic: No Pt smoker: non-smoker  Past Medical History  Diagnosis Date  . Prostate cancer     with radiation  . Bipolar 1 disorder   . Coronary artery disease   . Atrial fibrillation     persistent, with atypical atrial flutter  . Tachycardia-bradycardia     s/p PPM by Dr Verlon Setting, Gen change by Greggory Brandy 3/12  . Parkinson's disease   . Hypertension   . AAA (abdominal aortic aneurysm)     medical management  . Dementia     mild  . DVT (deep venous thrombosis)   . Hypoxemia 05/10/2012  . Sleep disorder 05/10/2012   Past Surgical History  Procedure Laterality Date  . Nasal sinus surgery    . Pacemaker insertion  04/08/2010    Initial pacemaker by Dr Verlon Setting, gen change by Greggory Brandy (MDT) 3/12  . Cataract extraction w/ intraocular lens  implant, bilateral     Social History History   Social History  . Marital Status: Married    Spouse Name: N/A    Number of Children: N/A  . Years of Education: N/A   Occupational History  . Not on file.   Social History Main Topics  . Smoking status: Never Smoker   . Smokeless tobacco: Never Used  . Alcohol Use: No  . Drug Use: No  . Sexual Activity: Not on file   Other Topics Concern  . Not on file   Social History Narrative  . No narrative on file   Family History Family History  Problem Relation Age of Onset  . Heart attack Mother   . Heart disease Father   . Heart disease  Brother     Heart Disease before age 63  . Heart attack Brother     Current Outpatient Prescriptions on File Prior to Visit  Medication Sig Dispense Refill  . acetic acid-hydrocortisone (VOSOL-HC) otic solution As needed      . Apixaban (ELIQUIS PO) Take by mouth.      . carbidopa-levodopa (SINEMET IR) 25-100 MG per tablet 2 tabs five times a day.  300 tablet  6  . Cholecalciferol (VITAMIN D PO) Take 50,000 Units by mouth once a week.       . cholecalciferol (VITAMIN D) 1000 UNITS tablet Take 1,000 Units by mouth daily.      . clonazePAM (KLONOPIN) 0.5 MG tablet 1/2-1 qhs  30 tablet  3  . desonide (DESOWEN) 0.05 % lotion As needed      . gabapentin (NEURONTIN) 300 MG capsule Take 300 mg by mouth 2 (two) times daily.       Marland Kitchen lithium 300 MG capsule Take 300 mg by mouth 1 day or 1 dose.        . metoprolol tartrate (LOPRESSOR) 25 MG tablet Take 25 mg by mouth 2 (two) times daily.        . Oxcarbazepine (TRILEPTAL) 300 MG tablet Take  300 mg by mouth 4 (four) times daily.       . Rivastigmine 13.3 MG/24HR PT24 Place 1 patch (13.3 mg total) onto the skin 1 day or 1 dose.  30 patch  5  . rosuvastatin (CRESTOR) 10 MG tablet Take 10 mg by mouth as directed. 1/2 tablet daily       No current facility-administered medications on file prior to visit.   Allergies  Allergen Reactions  . Penicillins Rash    ROS: See HPI for pertinent positives and negatives.  Physical Examination  Filed Vitals:   03/15/13 1020  BP: 131/83  Pulse: 67  Resp: 16   Filed Weights   03/15/13 1020  Weight: 182 lb (82.555 kg)   Body mass index is 26.11 kg/(m^2).  General: A&O x 3, WD, male.  Pulmonary: Sym exp, good air movt, CTAB, no rales, rhonchi, or wheezing.  Cardiac: RRR, Nl S1, S2, no detected murmur.   Carotid Bruits Left Right   Negative Negative   Aorta is not palpable Radial pulses are 2+ and =                          VASCULAR EXAM:                                                                                                          LE Pulses LEFT RIGHT       FEMORAL   palpable   palpable        POPLITEAL  not palpable   not palpable       POSTERIOR TIBIAL   palpable    palpable        DORSALIS PEDIS      ANTERIOR TIBIAL  palpable   palpable      Gastrointestinal: soft, NTND, -G/R, - HSM, - masses, - CVAT B.  Musculoskeletal: M/S 2/5 throughout, Extremities without ischemic changes.  Neurologic: CN 2-12 intact  except mild tremor in hands, Motor exam as listed above.  Non-Invasive Vascular Imaging  AAA Duplex (03/15/2013) ABDOMINAL AORTA DUPLEX EVALUATION    INDICATION: Abdominal aortic aneurysm    PREVIOUS INTERVENTION(S):     DUPLEX EXAM:     LOCATION DIAMETER AP (cm) DIAMETER TRANSVERSE (cm) VELOCITIES (cm/sec)  Aorta Proximal 2.7 2.8 39  Aorta Mid 3.3 3.4 43  Aorta Distal 3.1 3.2 53  Right Common Iliac Artery 1.4 1.3 101  Left Common Iliac Artery 1.1 1.1 87    Previous max aortic diameter:  3.4cm Date: 03/15/12     ADDITIONAL FINDINGS: Decreased visualization of the abdominal vasculature due to overlying bowel gas.     IMPRESSION: 1. Aneurysmal dilatation of the mid to distal abdominal aorta with a maximum diameter measurement of 3.4cm. 2. No hemodynamically significant stenosis of the abdominal aorta and bilateral proximal common iliac arteries.     Compared to the previous exam:  No significant change in the abdominal aortic aneurysm when compared to the previous exam on 03/15/12.        Medical  Decision Making  The patient is a 80 y.o. male who presents with asymptomatic small AAA with no increasing size.   Based on this patient's exam and diagnostic studies, the patient will follow up in 2 years  with the following studies: AAA Duplex.  The threshold for repair is AAA size > 5.5 cm, growth > 1 cm/yr, and symptomatic status.  I emphasized the importance of maximal medical management including strict control of blood pressure,  blood glucose, and lipid levels, antiplatelet agents, obtaining regular exercise, and  continued cessation of smoking.   The patient was given information about AAA including signs, symptoms, treatment, and how to minimize the risk of enlargement and rupture of aneurysms.    The patient was advised to call 911 should the patient experience sudden onset abdominal or back pain.   Thank you for allowing [place] to participate in this patient's care.  Clemon Chambers, RN, MSN, FNP-C [facility] [place] Office: [phone]  Clinic Physician: Scot Dock  03/15/2013, 10:18 AM

## 2013-03-15 NOTE — Patient Instructions (Signed)

## 2013-03-16 NOTE — Addendum Note (Signed)
Addended by: Mena Goes on: 03/16/2013 08:20 AM   Modules accepted: Orders

## 2013-05-04 ENCOUNTER — Ambulatory Visit (INDEPENDENT_AMBULATORY_CARE_PROVIDER_SITE_OTHER): Payer: Medicare Other | Admitting: Neurology

## 2013-05-04 ENCOUNTER — Ambulatory Visit
Admission: RE | Admit: 2013-05-04 | Discharge: 2013-05-04 | Disposition: A | Discharge status: Home / Self Care | Payer: Medicare Other | Source: Ambulatory Visit | Attending: Neurology | Admitting: Neurology

## 2013-05-04 ENCOUNTER — Telehealth: Payer: Self-pay | Admitting: Neurology

## 2013-05-04 ENCOUNTER — Encounter: Payer: Self-pay | Admitting: Neurology

## 2013-05-04 VITALS — BP 100/56 | HR 64 | Resp 14

## 2013-05-04 DIAGNOSIS — G250 Essential tremor: Secondary | ICD-10-CM

## 2013-05-04 DIAGNOSIS — G20 Parkinson's disease: Secondary | ICD-10-CM

## 2013-05-04 DIAGNOSIS — T50904A Poisoning by unspecified drugs, medicaments and biological substances, undetermined, initial encounter: Secondary | ICD-10-CM

## 2013-05-04 DIAGNOSIS — G9340 Encephalopathy, unspecified: Secondary | ICD-10-CM

## 2013-05-04 DIAGNOSIS — G253 Myoclonus: Secondary | ICD-10-CM

## 2013-05-04 DIAGNOSIS — Z5181 Encounter for therapeutic drug level monitoring: Secondary | ICD-10-CM

## 2013-05-04 DIAGNOSIS — G20A1 Parkinson's disease without dyskinesia, without mention of fluctuations: Secondary | ICD-10-CM

## 2013-05-04 DIAGNOSIS — G252 Other specified forms of tremor: Secondary | ICD-10-CM

## 2013-05-04 DIAGNOSIS — G251 Drug-induced tremor: Secondary | ICD-10-CM

## 2013-05-04 LAB — CBC WITH DIFFERENTIAL/PLATELET
Basophils Absolute: 0.1 10*3/uL (ref 0.0–0.1)
Basophils Relative: 1 % (ref 0–1)
Eosinophils Absolute: 0.3 10*3/uL (ref 0.0–0.7)
Eosinophils Relative: 3 % (ref 0–5)
HCT: 39.2 % (ref 39.0–52.0)
Hemoglobin: 13.1 g/dL (ref 13.0–17.0)
LYMPHS PCT: 10 % — AB (ref 12–46)
Lymphs Abs: 0.9 10*3/uL (ref 0.7–4.0)
MCH: 30.1 pg (ref 26.0–34.0)
MCHC: 33.4 g/dL (ref 30.0–36.0)
MCV: 90.1 fL (ref 78.0–100.0)
Monocytes Absolute: 0.7 10*3/uL (ref 0.1–1.0)
Monocytes Relative: 8 % (ref 3–12)
NEUTROS PCT: 78 % — AB (ref 43–77)
Neutro Abs: 6.6 10*3/uL (ref 1.7–7.7)
PLATELETS: 177 10*3/uL (ref 150–400)
RBC: 4.35 MIL/uL (ref 4.22–5.81)
RDW: 13.7 % (ref 11.5–15.5)
WBC: 8.5 10*3/uL (ref 4.0–10.5)

## 2013-05-04 LAB — AMMONIA: Ammonia: 39 umol/L (ref 16–53)

## 2013-05-04 NOTE — Telephone Encounter (Signed)
Patient's wife made aware CT stable.

## 2013-05-04 NOTE — Patient Instructions (Addendum)
1. Your provider has requested that you have labwork completed today. Please go to [facility] on the first floor of this building before leaving the office today. 2. Your provider has requested that you have a CT completed today. Please go to [facility] on the first floor of this building before leaving the office today. 3. Discontinue Neurontin. 4. Follow up after testing.

## 2013-05-04 NOTE — Progress Notes (Signed)
Jacob Boyd was seen today in the movement disorders clinic for neurologic f/u.    The patients wife states he was dx with ET when the patient was about 80 years old.  He was not put on medication for this initially, but was later put on inderal and had SE with that.  They are unusure of when the dx of PD came in but it has been over 10 years. They do not know the first symptom. They do not know when levodopa was added and are unsure if it is helpful.  He is on carbidopa/levodopa 25/250 three to four times per day but is very noncompliant.  He administers his own medication.  He initially told me that he takes his levodopa this morning, but it turns out he has not yet taken it today.  He has been on lithium since the 1960's.  He is on it for bipolar d/o.  His wife relates that he was told by Dr. Erling Cruz that his parkinsonism is due to lithium, but the psychiatrist told him that it was not.  He has been on the atypical antipsychotic medications in the past, but has been off of those for many years.   10/07/12:   The patients previous myoclonic jerks have virtually gone away with the decrease in the Neurontin.  Overall, the patient states that he feels much better.  His wife reports that he has been doing much better.  He has been walking and exercising with her faithfully.  He has been more faithful with his levodopa.  He is currently taking carbidopa/levodopa 25/100, 2 tablets 5 times per day.  He remains on the Exelon patch.  His memory has been stable.  His clonazepam has definitely helped him sleep more restfully.  I reviewed his pulmonary notes since last visit, which indicate that the patient was complaining of difficulty swallowing.  The patient and his wife both deny this today.  His wife states that he has not had any swallowing difficulty for 3 years.  His wife has asked about the results of his chest x-ray.  02/10/13:  The patient presents today with his wife who supplements the history.  The patient  has Parkinson's disease and is currently on carbidopa/levodopa 25/100, 2 tablets 5 times per day.  He is on lithium, which contributes significantly to tremor.  He is on clonazepam for REM behavior disorder and is doing well with that medication in terms of no SE but he is still kicking and jerking at night.  His wife thinks that it may make him sleepy during the day but is unsure because he doesn't do much activity during the day either.  He is on the Exelon patch for memory changes.  He has had no site reactions.  05/04/13 update:  The patient presents today as a work-in.  The patient has not been doing as well.  The patient's wife states that he is more confused.  He is having episodes of word finding difficulty.  He is seeing double at times.  There was one episode in which he fell 5 times in 2 or 3 days and then he stopped falling altogether.  He finished therapy about 2 or 3 weeks ago.  This morning, he had significant difficulty getting dressed.  His last lab work was in January.  I do not have a copy of that.  He is not sleeping well at night, but sleeps much of the day.  His wife feels that he needs the  clonazepam.  Taking neurontin 300 mg bid.  Wearing off:  no  How long before next dose:  n/a Falls:   no N/V:  no Hallucinations:  no  visual distortions: no Lightheaded:  no  Syncope: no Dyskinesia:  no    PREVIOUS MEDICATIONS: Seroquel, risperdol  ALLERGIES:   Allergies  Allergen Reactions  . Penicillins Rash    CURRENT MEDICATIONS:  Current Outpatient Prescriptions on File Prior to Visit  Medication Sig Dispense Refill  . acetic acid-hydrocortisone (VOSOL-HC) otic solution As needed      . Apixaban (ELIQUIS PO) Take by mouth.      . carbidopa-levodopa (SINEMET IR) 25-100 MG per tablet 2 tabs five times a day.  300 tablet  6  . Cholecalciferol (VITAMIN D PO) Take 50,000 Units by mouth once a week.       . cholecalciferol (VITAMIN D) 1000 UNITS tablet Take 1,000 Units by mouth  daily.      . clonazePAM (KLONOPIN) 0.5 MG tablet 1/2-1 qhs  30 tablet  3  . desonide (DESOWEN) 0.05 % lotion As needed      . gabapentin (NEURONTIN) 300 MG capsule Take 300 mg by mouth 2 (two) times daily.       Marland Kitchen lithium 300 MG capsule Take 300 mg by mouth 1 day or 1 dose.        . metoprolol tartrate (LOPRESSOR) 25 MG tablet Take 25 mg by mouth 2 (two) times daily.        . Oxcarbazepine (TRILEPTAL) 300 MG tablet Take 300 mg by mouth 4 (four) times daily.       . Rivastigmine 13.3 MG/24HR PT24 Place 1 patch (13.3 mg total) onto the skin 1 day or 1 dose.  30 patch  5  . rosuvastatin (CRESTOR) 10 MG tablet Take 10 mg by mouth as directed. 1/2 tablet daily       No current facility-administered medications on file prior to visit.    PAST MEDICAL HISTORY:   Past Medical History  Diagnosis Date  . Prostate cancer     with radiation  . Bipolar 1 disorder   . Coronary artery disease   . Atrial fibrillation     persistent, with atypical atrial flutter  . Tachycardia-bradycardia     s/p PPM by Dr Verlon Setting, Gen change by Greggory Brandy 3/12  . Parkinson's disease   . Hypertension   . AAA (abdominal aortic aneurysm)     medical management  . Dementia     mild  . DVT (deep venous thrombosis)   . Hypoxemia 05/10/2012  . Sleep disorder 05/10/2012    PAST SURGICAL HISTORY:   Past Surgical History  Procedure Laterality Date  . Nasal sinus surgery    . Pacemaker insertion  04/08/2010    Initial pacemaker by Dr Verlon Setting, gen change by Greggory Brandy (MDT) 3/12  . Cataract extraction w/ intraocular lens  implant, bilateral      SOCIAL HISTORY:   History   Social History  . Marital Status: Married    Spouse Name: N/A    Number of Children: N/A  . Years of Education: N/A   Occupational History  . Not on file.   Social History Main Topics  . Smoking status: Never Smoker   . Smokeless tobacco: Never Used  . Alcohol Use: No  . Drug Use: No  . Sexual Activity: Not on file   Other Topics Concern  . Not on  file   Social History Narrative  . No  narrative on file    FAMILY HISTORY:   Family Status  Relation Status Death Age  . Mother Deceased 60    old age  . Father Deceased 51    Heart disease  . Sister Alive     dementia  . Brother Deceased     4, MI, MVA, ?NPH, trauma  . Child Alive   . Child Alive   . Child Alive     ROS:  A complete 10 system review of systems was obtained and was unremarkable apart from what is mentioned above.  PHYSICAL EXAMINATION:    VITALS:   Filed Vitals:   05/04/13 1010  BP: 100/56  Pulse: 64  Resp: 14    GEN:  The patient appears stated age and is in NAD. HEENT:  Normocephalic, atraumatic.  The mucous membranes are moist. The superficial temporal arteries are without ropiness or tenderness. CV:  RRR Lungs:  CTAB Neck/HEME:  There are no carotid bruits bilaterally.  Neurological examination:  Orientation:   Today, he is alert and is interactive.   Cranial nerves: There is good facial symmetry. Pupils are equal round and reactive to light bilaterally. Fundoscopic exam reveals clear margins bilaterally. Extraocular muscles are intact. The visual fields are full to confrontational testing. The speech is fluent and clear. Soft palate rises symmetrically and there is no tongue deviation. Hearing is decreased to conversational tone. Sensation: Sensation is intact to light touch.  No asymmetries. Motor: Strength is 5/5 in the bilateral upper and lower extremities.   Shoulder shrug is equal and symmetric.  There is no pronator drift. Deep tendon reflexes: Deep tendon reflexes are 3/4 at the bilateral biceps, triceps, brachioradialis, patella and absent at the bilateral achilles. Plantar responses are downgoing bilaterally.  Movement examination: Tone: There is mild UE rigidity, L more than R today Abnormal movements: There is both no resting tremor today.  There is definite myoclonus on the left (arm).   Coordination:  There is definite decremation  with RAM's, seen more prominently in the left hemibody than the right, but seen with all forms including alternating supination and pronation of the forearm, hand opening and closing, finger taps, heel taps and toe taps. Gait and Station: The patient was shuffling.  Walked with walker.  LABS:    Chemistry      Component Value Date/Time   NA 139 10/10/2012 1015   K 4.7 10/10/2012 1015   CL 107 10/10/2012 1015   CO2 26 10/10/2012 1015   BUN 35* 10/10/2012 1015   CREATININE 1.5 10/10/2012 1015      Component Value Date/Time   CALCIUM 9.5 10/10/2012 1015   ALKPHOS 63 10/10/2012 1015   AST 14 10/10/2012 1015   ALT 7 10/10/2012 1015   BILITOT 0.5 10/10/2012 1015       ASSESSMENT/PLAN:  1.  Parkinsonism.  I suspect that this does represent idiopathic Parkinson's disease.  I reassured them that lithium is not the cause of the parkinsonism, but it certainly is contributing to tremor.  We talked about the fact that lithium does not cause parkinsonism, although some of the other atypical antipsychotic that he has been on certainly could.  Nonetheless, tardive parkinsonism is extremely rare and I do not suspect this.  I suspect that when he was diagnosed with essential tremor years ago, that he had a lithium-induced tremor and not essential tremor.  -I am going to keep him on the levodopa, and he will continue with carbidopa/levodopa 25/100  2 tablets  5 times a day (really only taking it 3-5 times per day as sleeps during much of day).  Risks, benefits, side effects and alternative therapies were discussed.  The opportunity to ask questions was given and they were answered to the best of my ability.  The patient expressed understanding and willingness to follow the outlined treatment protocols. 2.  Myoclonus.  -This was present again today.  Neurontin will be d/c  -Labs will be drawn including cbc, lithium, UA, chem, ammonia 3.  nocturnal hypoxemia.  -His sleep study showed no evidence of sleep apnea, but did show  oxygen saturations in the low 80s.  He had a pulmonology consult, and there were no additional recommendations. 4.  Memory loss, likely related to parkinsonism.  - he will continue with Exelon patch, 13.3 mg.  -We talked about the importance of both physical and mental exercises relates to memory.  -wife thinks that this is acutely worse so will do CT today.  To me, this looked stable.   5.  Bipolar disorder.  -He is under the care of a psychiatrist.  He has failed and had significant side effects to the atypical antipsychotics, particularly Seroquel. 6.  REM behavior disorder.  -I am going to continue the clonazepam, which initially helped but he is still having some kicking and acting out of the dream this. I think that much of the cause of his daytime hypersomnolence is lack of activity and also day/night reversal is an issue.  -I discussed with the patient and his wife that I do not want them blocking the door that he cannot get out.  This is a Estate agent hazard.

## 2013-05-04 NOTE — Telephone Encounter (Signed)
Message copied by Annamaria Helling on Thu May 04, 2013  3:37 PM ------      Message from: TAT, Burt: Thu May 04, 2013  3:08 PM       Luvenia Starch, please let pts wife know that head CT stable ------

## 2013-05-05 LAB — COMPREHENSIVE METABOLIC PANEL
ALBUMIN: 4.1 g/dL (ref 3.5–5.2)
AST: 13 U/L (ref 0–37)
Alkaline Phosphatase: 98 U/L (ref 39–117)
BUN: 36 mg/dL — ABNORMAL HIGH (ref 6–23)
CALCIUM: 9.8 mg/dL (ref 8.4–10.5)
CHLORIDE: 104 meq/L (ref 96–112)
CO2: 28 mEq/L (ref 19–32)
Creat: 1.4 mg/dL — ABNORMAL HIGH (ref 0.50–1.35)
GLUCOSE: 111 mg/dL — AB (ref 70–99)
Potassium: 4.8 mEq/L (ref 3.5–5.3)
SODIUM: 139 meq/L (ref 135–145)
Total Bilirubin: 0.4 mg/dL (ref 0.2–1.2)
Total Protein: 6.2 g/dL (ref 6.0–8.3)

## 2013-05-05 LAB — URINALYSIS, ROUTINE W REFLEX MICROSCOPIC
Bilirubin Urine: NEGATIVE
Glucose, UA: NEGATIVE mg/dL
HGB URINE DIPSTICK: NEGATIVE
Ketones, ur: NEGATIVE mg/dL
Leukocytes, UA: NEGATIVE
NITRITE: NEGATIVE
Protein, ur: NEGATIVE mg/dL
Specific Gravity, Urine: 1.013 (ref 1.005–1.030)
UROBILINOGEN UA: 0.2 mg/dL (ref 0.0–1.0)
pH: 6 (ref 5.0–8.0)

## 2013-05-05 LAB — LITHIUM LEVEL: Lithium Lvl: 1.2 mEq/L (ref 0.80–1.40)

## 2013-05-07 ENCOUNTER — Telehealth: Payer: Self-pay | Admitting: Neurology

## 2013-05-07 NOTE — Telephone Encounter (Signed)
Received call from pts wife just now.  Wanted results of labs

## 2013-05-07 NOTE — Telephone Encounter (Signed)
Gave wife results of labs.  Felt that husband was going downhill.  Advised to go to ER.  She felt that going to ER was a hassle.  Asked if this could be a progression of disease and I said it could be but that I thought that he needed to be evaluated by a medical practitioner and not just by a neurologist.  Pts wife understood.  Did not want to go to ER.  Said that she would contact Dr. Manya Silvas office in the AM.

## 2013-05-10 ENCOUNTER — Telehealth: Payer: Self-pay | Admitting: Neurology

## 2013-05-10 NOTE — Telephone Encounter (Signed)
Patient's wife called back and I spoke with her. She saw the patient's PCP on Monday and he states all his symptoms are neurologic. She wants to know where to go from here. I advised that per Dr Tat they can seek a second opinion with another neurologist. Patient's wife did not want to do this. Her main question is if this is going to get any better or is this going to stay the same or get worse and wants to know Dr Doristine Devoid opinion based on her experience. She is now unable to leave him alone in the house and wants to know what she needs to do to prepare for the future. I let her know I would pass this on to Dr Tat.

## 2013-05-10 NOTE — Telephone Encounter (Signed)
Patient's wife called and note documented from our office in her chart. Left message for patient's wife to call back. She is not a patient of ours.

## 2013-05-10 NOTE — Telephone Encounter (Addendum)
Tried to call patient's wife back. Disconnected.

## 2013-05-10 NOTE — Telephone Encounter (Signed)
As she and I discussed on the phone, he does have a neurodegenerative condition, and by definition, it is expected that these conditions do get worse with time.  i would be happy to refer them for another opinion to see if someone else has other thoughts.

## 2013-05-11 NOTE — Telephone Encounter (Signed)
Spoke with patient and relayed the message from Dr Tat. She expressed appreciation. She will call as needed.

## 2013-05-11 NOTE — Telephone Encounter (Signed)
Spoke with patient's wife and relayed the information. She wants to know if Dr Tat thinks he is a candidate for Hospice. I advised that Dr Tat did not see the decline that she has seen, that the patient has looked pretty stable over the last year, but I would ask her if she thinks this and get back with her on Monday. She expressed appreciation and understanding.

## 2013-05-11 NOTE — Telephone Encounter (Signed)
Left message on machine for patient to call back.

## 2013-05-11 NOTE — Telephone Encounter (Signed)
With hospice, I have to sign something that says that I think that he has less than 6 months to live.  I don't see that has necessarily has been the case.

## 2013-05-17 ENCOUNTER — Encounter: Payer: Medicare Other | Admitting: *Deleted

## 2013-05-19 ENCOUNTER — Ambulatory Visit: Payer: Medicare Other | Admitting: Neurology

## 2013-05-22 ENCOUNTER — Ambulatory Visit: Payer: BLUE CROSS/BLUE SHIELD | Admitting: Neurology

## 2013-05-23 ENCOUNTER — Encounter: Payer: Self-pay | Admitting: Neurology

## 2013-05-23 ENCOUNTER — Ambulatory Visit (INDEPENDENT_AMBULATORY_CARE_PROVIDER_SITE_OTHER): Payer: Medicare Other | Admitting: Neurology

## 2013-05-23 VITALS — BP 154/82 | HR 80 | Ht 70.0 in

## 2013-05-23 DIAGNOSIS — R451 Restlessness and agitation: Secondary | ICD-10-CM

## 2013-05-23 DIAGNOSIS — G251 Drug-induced tremor: Secondary | ICD-10-CM

## 2013-05-23 DIAGNOSIS — R4589 Other symptoms and signs involving emotional state: Secondary | ICD-10-CM

## 2013-05-23 DIAGNOSIS — G20 Parkinson's disease: Secondary | ICD-10-CM

## 2013-05-23 DIAGNOSIS — G252 Other specified forms of tremor: Secondary | ICD-10-CM

## 2013-05-23 DIAGNOSIS — F319 Bipolar disorder, unspecified: Secondary | ICD-10-CM

## 2013-05-23 DIAGNOSIS — G250 Essential tremor: Secondary | ICD-10-CM

## 2013-05-23 NOTE — Progress Notes (Signed)
Subjective:    Patient ID: Jacob Boyd is a 80 y.o. male.  HPI    Star Age, MD, PhD [facility] [address].O[address] [place], [place] [address]  Dear Dr. Brigitte Pulse,   I saw your patient, Jacob Boyd upon your kind request in my neurologic clinic today for second opinion of his parkinsonism. The patient is accompanied by his wife today. As you know, [name] is an 80 year old right-handed gentleman with a complex medical history bipolar disorder, prostate cancer, A. fib, hyperlipidemia, prior diagnosis of OSA, chronic kidney impairment, osteopenia, memory loss, status post pacemaker placement, who I saw a couple of times last year in January and April for sleep related issues. He is followed for his parkinsonism by Dr. Carles Collet and was last seen by her on 05/04/13. Dr. Carles Collet has most recently ordered some labs including ammonia level, lithium level, urinalysis, CBC and CMP and results were unremarkable with the exception of a slight increase in BUN and creatinine in keeping with mild dehydration but not much different from prior test results. He also had a head CT without contrast on 05/04/2013: Chronic changes without acute abnormality. In addition, personally reviewed the images through the PACS system. His wife provides most of the history and states that he has had a more rapid decline in his cognitive and motor functioning and exhibits problems sleeping at night, better sleep during the day, agitation, confusion, and disorientation. He can not be without supervision. His wife has help during the day but not at night. She is looking at more help to provide 24-7 supervision. His psychiatrist, Dr. Casimiro Needle has added Ativan and increased the dose. . Today, he reports feeling bad, but cannot explain further. He denies pain and says he has trouble talking. He does not choke on food. She has had help prn, but is reluctant to look into placement and wants to avoid it.  There is no report of VH or AH. He is hard of hearing and his R hearing aid "disappeared", per wife. He is total care per wife and he wears depends. He can control his bowels. He is written for Sinemet 2 pills 5 times a day but really is only taking 2 pills twice daily at this point. She feels that in the last month he has had a sudden decline in functioning. His psychiatrist did decrease his Trileptal to twice daily from 4 times a day. He continues to be on lithium and is no longer on gabapentin or Klonopin.  I first met him and his wife on 03/04/2012 at which time I suggested re-evaluation of his obstructive sleep apnea with another sleep study. Had a UPPP for sleep apnea in the past. He had a sleep study on 04/01/2011: Sleep efficiency was markedly reduced at 44.8% with a latency to sleep of 0 minutes. Wake after sleep onset was highly elevated at 216.5 minutes with severe sleep fragmentation noted. He had a high arousal index of 39 per hour due primarily to spontaneous arousals. He had a increased percentage of stage I and 2 sleep at 35.3 and 64.1% respectively, near absence of slow wave sleep and absence of REM sleep. He had rare mild snoring. There were no significant period leg movements of sleep. Overall his AHI was 1 per hour. He had an oxyhemoglobin desaturation nadir of 84% with a baseline of 94%. He did have some EKG changes consistent with atrial fibrillation which was not new.  He is a retired Teacher, music  with a Hx of BPAD, diagnosed in his late 78s - on lithium for over 40 years. 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 He also was Dx with ET years ago by Dr. Morrell Riddle at [facility] at the time. Sinemet has been helpful. His balance is impaired and he has had falls. He has A fib Hx and has a PM since 2006 and he recently had a CTH (given Hx of falls) on 03/07/12: there was mild peri-rolandic and perisylvian atrophy, mild-moderate chronic small vessel ischemic  disease and overall no significant change from CT on 03/02/05 was reported.  He was diagnosed with dementia years ago. He had a bad experience with a psychyatric medication and since then, around 2000 he has had progressive decline in his cognitive function. Coumadin for AF has been discontinued because of fall risk. He has bizarre dreams at night and fell out of bed once in the past. He has vivid dreams almost nightly and sometimes even in his naps. He has dream enactments often, but has not injured himself or his wife; he did kick her once. He has RLS and he kicks in his sleep. He has been switched from Aricept to exelon patch maybe a year ago. He has hearing aids, but not always in place. On 01/05/11: MMSE 22/30, but last week during his latest visit with Dr. Erling Cruz scored only 18/30. He has melanoma of the face with frequent excisions.  He had left C5 shingles. He has double vision of horizontal type. He had Doppler study of the carotids and 2-D echocardiogram which were unremarkable. On 07/06/11: MMSE 23/30. He refuses to use a walker or use a cane in the house and may use a walker outside, but has it in the car today. He has not fallen in about 2 months. He had an episode of confusion 11/2011 evaluated by Dr. Mare Ferrari with 2-D echocardiogram and Doppler study of the carotids. He is excessively sleepy during the day and snores at night, but not nearly as bad as in the past. He was diagnosed with OSA with a home sleep test in the 90s and is s/p UPPP. On 02/26/2012: MMSE 18/30, CDT 2/4, Geriatric depression scale 3/15 and Falls assessment tool score was 17.  His Past Medical History Is Significant For: Past Medical History  Diagnosis Date  . Prostate cancer     with radiation  . Bipolar 1 disorder   . Coronary artery disease   . Atrial fibrillation     persistent, with atypical atrial flutter  . Tachycardia-bradycardia     s/p PPM by Dr Verlon Setting, Gen change by Greggory Brandy 3/12  . Parkinson's disease   .  Hypertension   . AAA (abdominal aortic aneurysm)     medical management  . Dementia     mild  . DVT (deep venous thrombosis)   . Hypoxemia 05/10/2012  . Sleep disorder 05/10/2012    His Past Surgical History Is Significant For: Past Surgical History  Procedure Laterality Date  . Nasal sinus surgery    . Pacemaker insertion  04/08/2010    Initial pacemaker by Dr Verlon Setting, gen change by Greggory Brandy (MDT) 3/12  . Cataract extraction w/ intraocular lens  implant, bilateral      His Family History Is Significant For: Family History  Problem Relation Age of Onset  . Heart attack Mother   . Heart disease Father   . Heart disease Brother     Heart Disease before age 32  .  Heart attack Brother     His Social History Is Significant For: History   Social History  . Marital Status: Married    Spouse Name: N/A    Number of Children: N/A  . Years of Education: N/A   Social History Main Topics  . Smoking status: Never Smoker   . Smokeless tobacco: Never Used  . Alcohol Use: No  . Drug Use: No  . Sexual Activity: None   Other Topics Concern  . None   Social History Narrative  . None    His Allergies Are:  Allergies  Allergen Reactions  . Penicillins Rash  :   His Current Medications Are:  Outpatient Encounter Prescriptions as of 05/23/2013  Medication Sig  . acetic acid-hydrocortisone (VOSOL-HC) otic solution As needed  . Apixaban (ELIQUIS PO) Take by mouth.  . carbidopa-levodopa (SINEMET IR) 25-100 MG per tablet 2 tabs five times a day.  . Cholecalciferol (VITAMIN D PO) Take 50,000 Units by mouth once a week.   . cholecalciferol (VITAMIN D) 1000 UNITS tablet Take 1,000 Units by mouth daily.  . clonazePAM (KLONOPIN) 0.5 MG tablet 1/2-1 qhs  . desonide (DESOWEN) 0.05 % lotion As needed  . lithium 300 MG capsule Take 300 mg by mouth 1 day or 1 dose.    . metoprolol tartrate (LOPRESSOR) 25 MG tablet Take 25 mg by mouth 2 (two) times daily.    . Oxcarbazepine (TRILEPTAL) 300 MG tablet  Take 300 mg by mouth 2 (two) times daily.   . Rivastigmine 13.3 MG/24HR PT24 Place 1 patch (13.3 mg total) onto the skin 1 day or 1 dose.  . rosuvastatin (CRESTOR) 10 MG tablet Take 10 mg by mouth as directed. 1/2 tablet daily  . gabapentin (NEURONTIN) 300 MG capsule Take 300 mg by mouth 2 (two) times daily.   :   Review of Systems:  Out of a complete 14 point review of systems, all are reviewed and negative with the exception of these symptoms as listed below:  Review of Systems  Constitutional: Positive for fatigue and unexpected weight change.  HENT: Positive for hearing loss and trouble swallowing.   Eyes: Negative.   Respiratory: Negative.   Cardiovascular: Positive for chest pain.  Gastrointestinal: Negative.   Endocrine: Positive for cold intolerance, heat intolerance and polydipsia.  Genitourinary: Negative.   Musculoskeletal: Negative.   Allergic/Immunologic: Negative.   Neurological: Positive for weakness.  Hematological: Negative.   Psychiatric/Behavioral: Positive for suicidal ideas, hallucinations, behavioral problems, confusion, sleep disturbance (insomnia, e.d.s.), dysphoric mood, decreased concentration and agitation. The patient is nervous/anxious.     Objective:  Neurologic Exam  Physical Exam Physical Examination:   Filed Vitals:   05/23/13 1007  BP: 154/82  Pulse: 80    General Examination: The patient is a very pleasant 80 y.o. male in no acute distress, he is situated in his WC and fidgety and restless, mildly agitated, wanting to leave. He follows command with mimicking, not verbal only.  HEENT exam: Normocephalic, atraumatic. He is hard of hearing with one hearing aid in place on the L. Nuchal tone is mild to moderately increased. He has no lip, or neck or jaw tremor. His speech is very slightly dysarthric and mild to moderately hypophonic. Extraocular tracking is mildly impaired. Pupils are reactive to light. Face is symmetric with mild decrease in  facial animation noted. He has no carotid bruits. Airway examination reveals status post UPPP and moderate mouth dryness and dental hygiene is poor with several missing teeth.  Chest is clear to auscultation without wheezing or rhonchi. Heart sounds are normal but irregularly irregular and no murmurs, rubs or gallops are audible. Abdomen is soft, nontender with normal bowel sounds appreciated. He has no pitting edema in the distal lower extremities bilaterally. Skin is rather dry.  Neurologically: Mental status: The patient is awake, alert and oriented to self and circumstance, month, not year, not date. Cranial nerves are as described under HEENT exam. Motor exam reveals increased tone in the upper extremities bilaterally with mild cogwheeling noted. He does not have much in the way of resting tremor on the R. He does have an intermittent resting tremor in the left upper extremity. He has a mild to moderate postural tremor in both upper extremities and moderate action tremor. Finger taps and fine motor skills are moderately impaired, left worse than right. Foot agility is mildly impaired b/l and foot taps are mildly impaired b/l. Sensory exam is intact, but he is not able to participate in more sophisticated testing. I did not have him walk for me as he did not bring his walker and I did not feel it was safe. He is able to stand up pushing himself up from his wheelchair. His posture is moderately stooped.  Assessment and Plan:   In summary, Mr. Cihlar  is a 81 year old gentleman with a complex medical history of bipolar disorder, prostate cancer, A. fib, hyperlipidemia, prior diagnosis of OSA, chronic kidney impairment, osteopenia, memory loss, status post pacemaker placement, who has tremors, mild parkinsonism, and memory loss. I think it is going to be really difficult to tease out what is his primary problem. I think he is at a point where he is suffering from medication-induced tremors, mild  parkinsonism, and some medication side effects including sedation, akathisia, in the context of an underlying mood disorder including anxiety. He is written for Sinemet 2 pills 5 times a day but really is currently only taking 2 pills twice daily. It is possible that the Sinemet is causing him daytime somnolence and lightheadedness. I would recommend scheduling back on as many medications as possible. Therefore, I would recommend that he reduce his Sinemet to one pill 3 times a day and talked to Dr. Blanch Media about this. I suggested that they also discuss with his psychiatrist the possibility of gradually reducing his medications. As far as the parkinsonism goes he really has mild findings overall. He does have a moderate degree of tremor which may be a drug-induced tremor. I had a long chat with his wife and explained to her that he may have day-to-day fluctuation in his cognitive and motor functioning and it does not always indicate a sudden incident or a sign of a rapid decline. Sometimes as patient's age we have to scale back on medications rather than add more medication. I did ask her to push oral fluid intake and good nutrition. I asked her to look into getting help consistently during the day and possibly also at night so she get some respite. I explained to her that I will make my recommendations to all his physicians and copy them in my note today. They are encouraged to keep her appointment with Dr. Carles Collet as planned.  Thank you very much for your referral! I appreciate the opportunity to provide a second opinion.   Best regards,  Star Age, MD, PhD [facility]

## 2013-05-23 NOTE — Patient Instructions (Signed)
I would recommend reducing your sinemet from 2 pills twice daily to 1 pill 3 times a day.  Follow up with Dr. Carles Collet as planned.  Follow up with Dr. Brigitte Pulse as planned.  Follow up with Dr. Casimiro Needle as planned.  I will make my recommendations and copy your doctors.

## 2013-05-31 ENCOUNTER — Inpatient Hospital Stay (HOSPITAL_COMMUNITY): Payer: Medicare Other

## 2013-05-31 ENCOUNTER — Inpatient Hospital Stay (HOSPITAL_COMMUNITY)
Admission: AD | Admit: 2013-05-31 | Discharge: 2013-07-03 | DRG: 071 | Disposition: E | Payer: Medicare Other | Source: Ambulatory Visit | Attending: Internal Medicine | Admitting: Internal Medicine

## 2013-05-31 ENCOUNTER — Encounter (HOSPITAL_COMMUNITY): Payer: Self-pay

## 2013-05-31 ENCOUNTER — Encounter: Payer: Self-pay | Admitting: *Deleted

## 2013-05-31 DIAGNOSIS — G9341 Metabolic encephalopathy: Principal | ICD-10-CM

## 2013-05-31 DIAGNOSIS — M949 Disorder of cartilage, unspecified: Secondary | ICD-10-CM

## 2013-05-31 DIAGNOSIS — Z950 Presence of cardiac pacemaker: Secondary | ICD-10-CM

## 2013-05-31 DIAGNOSIS — E785 Hyperlipidemia, unspecified: Secondary | ICD-10-CM | POA: Diagnosis present

## 2013-05-31 DIAGNOSIS — Z515 Encounter for palliative care: Secondary | ICD-10-CM

## 2013-05-31 DIAGNOSIS — G4733 Obstructive sleep apnea (adult) (pediatric): Secondary | ICD-10-CM | POA: Diagnosis present

## 2013-05-31 DIAGNOSIS — M899 Disorder of bone, unspecified: Secondary | ICD-10-CM | POA: Diagnosis present

## 2013-05-31 DIAGNOSIS — N289 Disorder of kidney and ureter, unspecified: Secondary | ICD-10-CM

## 2013-05-31 DIAGNOSIS — C3490 Malignant neoplasm of unspecified part of unspecified bronchus or lung: Secondary | ICD-10-CM | POA: Diagnosis present

## 2013-05-31 DIAGNOSIS — R0609 Other forms of dyspnea: Secondary | ICD-10-CM | POA: Diagnosis present

## 2013-05-31 DIAGNOSIS — I4892 Unspecified atrial flutter: Secondary | ICD-10-CM | POA: Diagnosis present

## 2013-05-31 DIAGNOSIS — E440 Moderate protein-calorie malnutrition: Secondary | ICD-10-CM | POA: Diagnosis present

## 2013-05-31 DIAGNOSIS — G3183 Dementia with Lewy bodies: Secondary | ICD-10-CM

## 2013-05-31 DIAGNOSIS — Z8546 Personal history of malignant neoplasm of prostate: Secondary | ICD-10-CM

## 2013-05-31 DIAGNOSIS — I714 Abdominal aortic aneurysm, without rupture, unspecified: Secondary | ICD-10-CM | POA: Diagnosis present

## 2013-05-31 DIAGNOSIS — R410 Disorientation, unspecified: Secondary | ICD-10-CM

## 2013-05-31 DIAGNOSIS — R52 Pain, unspecified: Secondary | ICD-10-CM | POA: Diagnosis present

## 2013-05-31 DIAGNOSIS — I4891 Unspecified atrial fibrillation: Secondary | ICD-10-CM | POA: Diagnosis present

## 2013-05-31 DIAGNOSIS — F319 Bipolar disorder, unspecified: Secondary | ICD-10-CM | POA: Diagnosis present

## 2013-05-31 DIAGNOSIS — R1310 Dysphagia, unspecified: Secondary | ICD-10-CM | POA: Diagnosis present

## 2013-05-31 DIAGNOSIS — R451 Restlessness and agitation: Secondary | ICD-10-CM

## 2013-05-31 DIAGNOSIS — D490 Neoplasm of unspecified behavior of digestive system: Secondary | ICD-10-CM | POA: Diagnosis present

## 2013-05-31 DIAGNOSIS — IMO0002 Reserved for concepts with insufficient information to code with codable children: Secondary | ICD-10-CM | POA: Diagnosis present

## 2013-05-31 DIAGNOSIS — Z8249 Family history of ischemic heart disease and other diseases of the circulatory system: Secondary | ICD-10-CM

## 2013-05-31 DIAGNOSIS — F0280 Dementia in other diseases classified elsewhere without behavioral disturbance: Secondary | ICD-10-CM | POA: Diagnosis present

## 2013-05-31 DIAGNOSIS — R0989 Other specified symptoms and signs involving the circulatory and respiratory systems: Secondary | ICD-10-CM | POA: Diagnosis present

## 2013-05-31 DIAGNOSIS — I129 Hypertensive chronic kidney disease with stage 1 through stage 4 chronic kidney disease, or unspecified chronic kidney disease: Secondary | ICD-10-CM | POA: Diagnosis present

## 2013-05-31 DIAGNOSIS — N179 Acute kidney failure, unspecified: Secondary | ICD-10-CM | POA: Diagnosis present

## 2013-05-31 DIAGNOSIS — E870 Hyperosmolality and hypernatremia: Secondary | ICD-10-CM | POA: Diagnosis present

## 2013-05-31 DIAGNOSIS — Z923 Personal history of irradiation: Secondary | ICD-10-CM

## 2013-05-31 DIAGNOSIS — Z86718 Personal history of other venous thrombosis and embolism: Secondary | ICD-10-CM

## 2013-05-31 DIAGNOSIS — Z66 Do not resuscitate: Secondary | ICD-10-CM | POA: Diagnosis not present

## 2013-05-31 DIAGNOSIS — R0600 Dyspnea, unspecified: Secondary | ICD-10-CM

## 2013-05-31 DIAGNOSIS — I2510 Atherosclerotic heart disease of native coronary artery without angina pectoris: Secondary | ICD-10-CM | POA: Diagnosis present

## 2013-05-31 DIAGNOSIS — N189 Chronic kidney disease, unspecified: Secondary | ICD-10-CM | POA: Diagnosis present

## 2013-05-31 DIAGNOSIS — G20 Parkinson's disease: Secondary | ICD-10-CM

## 2013-05-31 DIAGNOSIS — R9431 Abnormal electrocardiogram [ECG] [EKG]: Secondary | ICD-10-CM | POA: Diagnosis not present

## 2013-05-31 DIAGNOSIS — R918 Other nonspecific abnormal finding of lung field: Secondary | ICD-10-CM | POA: Diagnosis present

## 2013-05-31 LAB — TSH: TSH: 0.826 u[IU]/mL (ref 0.350–4.500)

## 2013-05-31 MED ORDER — HALOPERIDOL LACTATE 5 MG/ML IJ SOLN
2.5000 mg | Freq: Once | INTRAMUSCULAR | Status: AC
  Administered 2013-05-31: 2.5 mg via INTRAVENOUS

## 2013-05-31 MED ORDER — HALOPERIDOL LACTATE 5 MG/ML IJ SOLN
2.0000 mg | INTRAMUSCULAR | Status: DC | PRN
  Administered 2013-05-31: 2 mg via INTRAVENOUS
  Filled 2013-05-31: qty 1

## 2013-05-31 MED ORDER — OXCARBAZEPINE 300 MG PO TABS: 300.0000 mg | ORAL_TABLET | Freq: Two times a day (BID) | ORAL | Status: DC

## 2013-05-31 MED ORDER — ONDANSETRON HCL 4 MG/2ML IJ SOLN: 4.0000 mg | Freq: Four times a day (QID) | INTRAMUSCULAR | Status: DC | PRN

## 2013-05-31 MED ORDER — POLYETHYLENE GLYCOL 3350 17 G PO PACK
17.0000 g | PACK | Freq: Every day | ORAL | Status: DC | PRN
  Filled 2013-05-31: qty 1

## 2013-05-31 MED ORDER — MORPHINE SULFATE 2 MG/ML IJ SOLN
2.0000 mg | INTRAMUSCULAR | Status: DC | PRN
  Administered 2013-05-31 – 2013-06-04 (×19): 2 mg via INTRAVENOUS
  Filled 2013-05-31 (×19): qty 1

## 2013-05-31 MED ORDER — MORPHINE SULFATE 2 MG/ML IJ SOLN
1.0000 mg | Freq: Once | INTRAMUSCULAR | Status: AC
  Administered 2013-05-31: 1 mg via INTRAVENOUS

## 2013-05-31 MED ORDER — RIVASTIGMINE 13.3 MG/24HR TD PT24: 1.0000 | MEDICATED_PATCH | TRANSDERMAL | Status: DC

## 2013-05-31 MED ORDER — ONDANSETRON HCL 4 MG PO TABS: 4.0000 mg | ORAL_TABLET | Freq: Four times a day (QID) | ORAL | Status: DC | PRN

## 2013-05-31 MED ORDER — APIXABAN 2.5 MG PO TABS
2.5000 mg | ORAL_TABLET | Freq: Two times a day (BID) | ORAL | Status: DC
  Administered 2013-05-31: 2.5 mg via ORAL
  Filled 2013-05-31 (×3): qty 1

## 2013-05-31 MED ORDER — ACETAMINOPHEN 325 MG PO TABS: 650.0000 mg | ORAL_TABLET | Freq: Four times a day (QID) | ORAL | Status: DC | PRN

## 2013-05-31 MED ORDER — MORPHINE SULFATE 2 MG/ML IJ SOLN
INTRAMUSCULAR | Status: AC
  Filled 2013-05-31: qty 1

## 2013-05-31 MED ORDER — ACETAMINOPHEN 650 MG RE SUPP
650.0000 mg | Freq: Four times a day (QID) | RECTAL | Status: DC | PRN
  Administered 2013-06-03 – 2013-06-04 (×2): 650 mg via RECTAL
  Filled 2013-05-31 (×2): qty 1

## 2013-05-31 MED ORDER — LITHIUM CITRATE 300 MG/5 ML PO SYRP
300.0000 mg | Freq: Every day | ORAL | Status: DC
  Administered 2013-05-31: 23:00:00 via ORAL
  Filled 2013-05-31 (×4): qty 5

## 2013-05-31 MED ORDER — HALOPERIDOL LACTATE 5 MG/ML IJ SOLN
INTRAMUSCULAR | Status: AC
  Filled 2013-05-31: qty 1

## 2013-05-31 MED ORDER — HALOPERIDOL LACTATE 5 MG/ML IJ SOLN
2.5000 mg | Freq: Four times a day (QID) | INTRAMUSCULAR | Status: DC | PRN
  Administered 2013-06-01 – 2013-06-02 (×5): 2.5 mg via INTRAVENOUS
  Filled 2013-05-31 (×5): qty 1

## 2013-05-31 MED ORDER — OXCARBAZEPINE 300 MG/5ML PO SUSP
300.0000 mg | Freq: Two times a day (BID) | ORAL | Status: DC
  Administered 2013-05-31: 60 mg via ORAL
  Filled 2013-05-31 (×5): qty 5

## 2013-05-31 MED ORDER — SODIUM CHLORIDE 0.9 % IV SOLN
INTRAVENOUS | Status: DC
Start: 2013-05-31 — End: 2013-06-02
  Administered 2013-05-31 – 2013-06-02 (×3): via INTRAVENOUS

## 2013-05-31 MED ORDER — RIVASTIGMINE 4.6 MG/24HR TD PT24: 13.3000 mg | MEDICATED_PATCH | Freq: Every day | TRANSDERMAL | Status: DC

## 2013-05-31 MED ORDER — ALUM & MAG HYDROXIDE-SIMETH 200-200-20 MG/5ML PO SUSP
30.0000 mL | Freq: Four times a day (QID) | ORAL | Status: DC | PRN
Start: 2013-05-31 — End: 2013-06-02

## 2013-05-31 MED ORDER — RIVASTIGMINE 13.3 MG/24HR TD PT24
1.0000 | MEDICATED_PATCH | Freq: Every day | TRANSDERMAL | Status: DC
  Administered 2013-06-01 – 2013-06-03 (×3): 13.3 mg via TRANSDERMAL

## 2013-05-31 MED ORDER — LITHIUM CARBONATE 300 MG PO CAPS: 300.0000 mg | ORAL_CAPSULE | ORAL | Status: DC

## 2013-05-31 MED ORDER — CARBIDOPA-LEVODOPA 25-100 MG PO TABS
1.0000 | ORAL_TABLET | Freq: Three times a day (TID) | ORAL | Status: DC
  Administered 2013-05-31: 1 via ORAL
  Filled 2013-05-31 (×8): qty 1

## 2013-05-31 MED ORDER — METOPROLOL TARTRATE 25 MG PO TABS
25.0000 mg | ORAL_TABLET | Freq: Two times a day (BID) | ORAL | Status: DC
  Administered 2013-05-31: 25 mg via ORAL
  Filled 2013-05-31 (×5): qty 1

## 2013-05-31 MED ORDER — LORAZEPAM 2 MG/ML IJ SOLN
0.5000 mg | INTRAMUSCULAR | Status: DC | PRN
  Administered 2013-05-31 – 2013-06-02 (×6): 0.5 mg via INTRAVENOUS
  Filled 2013-05-31 (×6): qty 1

## 2013-05-31 MED ORDER — HALOPERIDOL LACTATE 5 MG/ML IJ SOLN
2.0000 mg | Freq: Once | INTRAMUSCULAR | Status: AC
  Administered 2013-05-31: 2 mg via INTRAVENOUS

## 2013-05-31 NOTE — Consult Note (Signed)
NEURO HOSPITALIST CONSULT NOTE    Reason for Consult: AMS  HPI:                                                                                                                                          Jacob Boyd is an 80 y.o. male  with a history of Parkinson's disease, dementia, and bipolar disorder who is here today with his wife and daughter-in-law for reevaluation of altered mental status. The first noticed a change 5 weeks ago when had trouble word-finding and seemed to be stuttering on his words. This was an acute change. Saw Dr. Carles Collet several days later who did lab work including lithium level, CBC, CMET, ammonia and Head CT which were normal. Per Dr. Arturo Morton not patient is non complinat with his Sinemet. He has been on lithium since the 1960's. He is on it for bipolar d/o. His wife relates that he was told by Dr. Erling Cruz that his parkinsonism is due to lithium, but the psychiatrist told him that it was not. He has been on the atypical antipsychotic medications in the past, but has been off of those for many years.  On 05/04/13 patient was noted to not be doing as well, more confused and word finding difficulty. He had fell 5 times and was not sleeping well.  Saw Dr. Forde Dandy the following week. Referred to Dr. Rexene Alberts for 2nd opinion who noted (  "He is written for Sinemet 2 pills 5 times a day but really is currently only taking 2 pills twice daily. It is possible that the Sinemet is causing him daytime somnolence and lightheadedness. I would recommend scheduling back on as many medications as possible. Therefore, I would recommend that he reduce his Sinemet to one pill 3 times a day"). Recently his  Klonopin was stopped 4 days ago due to it making him weaker. Recently he has been onaAtivan 4 times a day.  Per note patient has not been sleeping at all X 48 hours, becoming more combative. In addition he has not been taking his medications for 2 days. Patient is being admitted to hospital  for acute worsening of delirium, agitation, sleep disturbance and poor oral intake. Neurology and psych were asked to see patient.    Wife is requesting Dr. Harlow Mares Psychiatrist to see patient.   Recent Labs: 05/04/13 Ammonia 39 Lithium 1.2 UA (-)  In Process TSH UA  Past Medical History  Diagnosis Date  . Prostate cancer     with radiation  . Bipolar 1 disorder   . Coronary artery disease   . Atrial fibrillation     persistent, with atypical atrial flutter  . Tachycardia-bradycardia     s/p PPM by Dr Verlon Setting, Gen change by Greggory Brandy 3/12  . Parkinson's disease   .  Hypertension   . AAA (abdominal aortic aneurysm)     medical management  . Dementia     mild  . DVT (deep venous thrombosis)   . Hypoxemia 05/10/2012  . Sleep disorder 05/10/2012    Past Surgical History  Procedure Laterality Date  . Nasal sinus surgery    . Pacemaker insertion  04/08/2010    Initial pacemaker by Dr Verlon Setting, gen change by Greggory Brandy (MDT) 3/12  . Cataract extraction w/ intraocular lens  implant, bilateral      Family History  Problem Relation Age of Onset  . Heart attack Mother   . Heart disease Father   . Heart disease Brother     Heart Disease before age 40  . Heart attack Brother      Social History:  reports that he has never smoked. He has never used smokeless tobacco. He reports that he does not drink alcohol or use illicit drugs.  Allergies  Allergen Reactions  . Penicillins Rash    MEDICATIONS:                                                                                                                     Scheduled: . apixaban  2.5 mg Oral BID  . carbidopa-levodopa  1 tablet Oral TID  . haloperidol lactate      . haloperidol lactate  2.5 mg Intravenous Once  . lithium carbonate  300 mg Oral 1 day or 1 dose  . metoprolol tartrate  25 mg Oral BID  . Oxcarbazepine  300 mg Oral BID     ROS:                                                                                                                                        History obtained from unobtainable from patient due to mental status and lack of cooperation    Blood pressure 154/85, pulse 87, temperature 98 F (36.7 C), resp. rate 20, height 5\' 10"  (1.778 m), weight 67.2 kg (148 lb 2.4 oz).   Neurologic Examination:  Mental Status: Alert, not oriented, agitated and when attempted to do physical exam he is combative throwing punches and kicking.  Speech dysarthric, minimal and not capable of being understood. Follows no commands. Cranial Nerves: II: Discs unable to visualize; blinks to threat bilaterally, pupils equal, round, reactive to light and accommodation III,IV, VI: ptosis not present, extra-ocular motions intact bilaterally V,VII: face symmetric, facial light touch sensation unable to assess due to patients aggression VIII: hearing normal bilaterally IX,X: gag reflex present XI: bilateral shoulder shrug XII: unable to assess tongue  Motor: Moving all extremities 5/5 strength with spontaneous and purposeful movement.  He shows a postural and action tremor.  Unable to assess tone.  Sensory: withdrawals from pain Deep Tendon Reflexes:  Unable to assess due to patients aggression and agitation.   Plantars: Right: downgoing   Left: downgoing Cerebellar: Unable to assess Gait: unable to assess CV: pulses palpable throughout    Lab Results: Basic Metabolic Panel: No results found for this basename: NA, K, CL, CO2, GLUCOSE, BUN, CREATININE, CALCIUM, MG, PHOS,  in the last 168 hours  Liver Function Tests: No results found for this basename: AST, ALT, ALKPHOS, BILITOT, PROT, ALBUMIN,  in the last 168 hours No results found for this basename: LIPASE, AMYLASE,  in the last 168 hours No results found for this basename: AMMONIA,  in the last 168 hours  CBC: No results found for this basename: WBC,  NEUTROABS, HGB, HCT, MCV, PLT,  in the last 168 hours  Cardiac Enzymes: No results found for this basename: CKTOTAL, CKMB, CKMBINDEX, TROPONINI,  in the last 168 hours  Lipid Panel: No results found for this basename: CHOL, TRIG, HDL, CHOLHDL, VLDL, LDLCALC,  in the last 168 hours  CBG: No results found for this basename: GLUCAP,  in the last 168 hours  Microbiology: Results for orders placed in visit on 06/15/12  URINE CULTURE     Status: None   Collection Time    06/15/12 11:49 AM      Result Value Ref Range Status   Colony Count 30,000 COLONIES/ML   Final   Organism ID, Bacteria Multiple bacterial morphotypes present, none   Final   Organism ID, Bacteria predominant. Suggest appropriate recollection if    Final   Organism ID, Bacteria clinically indicated.   Final    Coagulation Studies: No results found for this basename: LABPROT, INR,  in the last 72 hours  Imaging:  Head CT 05/04/13 FINDINGS:  The bony calvarium is intact. No gross soft tissue abnormality is  noted. Mild atrophic changes are seen commensurate with the  patient's given age. Scattered white matter chronic ischemic changes  noted. No acute hemorrhage, acute infarction or space-occupying mass  lesion is identified. Right basal ganglia calcifications are seen.  IMPRESSION:  Chronic changes without acute abnormality.    X-ray Chest Pa And Lateral   05/23/2013   CLINICAL DATA:  leukocytosis  EXAM: CHEST  2 VIEW  COMPARISON:  DG CHEST 2 VIEW dated 09/08/2012  FINDINGS: Low lung volumes. Cardiac silhouette within upper limits of normal. Atherosclerotic calcification the aorta. A left chest wall dual-chamber cardiac pacing unit. The consolidative mass like density adjacent to the aortic arch, described on previous chest radiograph, has increased in size when compared to the previous study. This finding is concerning for a mass and further evaluation with contrasted chest CT recommended. Linear area of increased  density appreciated within the left lung base differential is considerations include scarring versus atelectasis. There is increased  go density in the right peritracheal region. The small nodular density in the right mid lung slightly more prominent when compared to prior.  IMPRESSION: Findings concerning for masses in the right peritracheal and periaortic regions. Further evaluation with contrasted chest CT is recommended. These results will be called to the ordering clinician or representative by the Radiologist Assistant, and communication documented in the PACS Dashboard.  Slightly more prominent right mid lung pulmonary nodule  Atelectasis versus scarring right lung base.   Electronically Signed   By: Margaree Mackintosh M.D.   On: 05/22/2013 16:30       Assessment and plan per attending neurologist  Etta Quill PA-C [facility] [phone]  05/30/2013, 5:07 PM   Assessment/Plan:  80 YO male with known dementia and PD who is followed by Dr. Carles Collet as a out patient .  Over the past 5 weeks patient has had a fairly rapid decline in mentation and ability to perform ADLS.  He has been admitted due to poor oral intake, dehydration and aggressive behavior. As noted above his Sinemet has been decreased from 5 times daily to TID but no change has been noted. Recent neurological work up as out patient has been negative.  Patient currently is agitated and confused but moving all extremities. At this time would make no changes in his medication until psych has seen and evaluated patient.  Will start him on Haldol 2 mg Q 4 hours PRN for agitation with further management per recommendations of Psychiatry service. Sinemet IR 25-100 is also been restarted on a 3 times a day schedule.  Etiology for patient's acute encephalopathy with delirium is most likely multifactorial, including underlying dementia as well as volume depletion and possibly recurrent manic state.  Recommend CT scan of the head to rule  out acute intracranial lesion, although stroke is somewhat unlikely.  We will continue to follow this patient closely with you.   I personally participated in this patient's evaluation and management, including formulating above clinical assessment and management recommendations.  Rush Farmer M.D. [facility] [phone]
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
## 2013-05-31 NOTE — Progress Notes (Signed)
Patient is calm and sleeping at this time.Sandie Ano RN

## 2013-05-31 NOTE — Progress Notes (Signed)
Results of chest x-ray called into Dr. Raul Del office,I spoke to Rojelio Brenner- Md's nurse.No new orders at this time.Sandie Ano RN

## 2013-05-31 NOTE — Progress Notes (Signed)
Patient arrived to unit at this time,patient is confused and combative. Placed comfortably in bed.- Will continue to monitor patient.- Sandie Ano RN

## 2013-06-01 ENCOUNTER — Other Ambulatory Visit: Payer: Self-pay

## 2013-06-01 ENCOUNTER — Ambulatory Visit (HOSPITAL_COMMUNITY): Payer: Medicare Other

## 2013-06-01 ENCOUNTER — Inpatient Hospital Stay (HOSPITAL_COMMUNITY): Payer: Medicare Other

## 2013-06-01 DIAGNOSIS — N179 Acute kidney failure, unspecified: Secondary | ICD-10-CM | POA: Diagnosis present

## 2013-06-01 DIAGNOSIS — R918 Other nonspecific abnormal finding of lung field: Secondary | ICD-10-CM | POA: Diagnosis present

## 2013-06-01 DIAGNOSIS — G9341 Metabolic encephalopathy: Principal | ICD-10-CM

## 2013-06-01 LAB — CBC
HCT: 40.5 % (ref 39.0–52.0)
Hemoglobin: 13.3 g/dL (ref 13.0–17.0)
MCH: 29.9 pg (ref 26.0–34.0)
MCHC: 32.8 g/dL (ref 30.0–36.0)
MCV: 91 fL (ref 78.0–100.0)
PLATELETS: 131 10*3/uL — AB (ref 150–400)
RBC: 4.45 MIL/uL (ref 4.22–5.81)
RDW: 13 % (ref 11.5–15.5)
WBC: 10.8 10*3/uL — AB (ref 4.0–10.5)

## 2013-06-01 LAB — BASIC METABOLIC PANEL
BUN: 37 mg/dL — ABNORMAL HIGH (ref 6–23)
CALCIUM: 9.6 mg/dL (ref 8.4–10.5)
CO2: 21 mEq/L (ref 19–32)
Chloride: 112 mEq/L (ref 96–112)
Creatinine, Ser: 2 mg/dL — ABNORMAL HIGH (ref 0.50–1.35)
GFR, EST AFRICAN AMERICAN: 34 mL/min — AB (ref >90)
GFR, EST NON AFRICAN AMERICAN: 29 mL/min — AB (ref >90)
GLUCOSE: 100 mg/dL — AB (ref 70–99)
POTASSIUM: 3.4 meq/L — AB (ref 3.7–5.3)
SODIUM: 146 meq/L (ref 137–147)

## 2013-06-01 LAB — URINALYSIS, ROUTINE W REFLEX MICROSCOPIC
Bilirubin Urine: NEGATIVE
Glucose, UA: NEGATIVE mg/dL
HGB URINE DIPSTICK: NEGATIVE
Ketones, ur: NEGATIVE mg/dL
LEUKOCYTES UA: NEGATIVE
NITRITE: NEGATIVE
Protein, ur: NEGATIVE mg/dL
SPECIFIC GRAVITY, URINE: 1.015 (ref 1.005–1.030)
UROBILINOGEN UA: 0.2 mg/dL (ref 0.0–1.0)
pH: 5 (ref 5.0–8.0)

## 2013-06-01 MED ORDER — ENOXAPARIN SODIUM 80 MG/0.8ML ~~LOC~~ SOLN
70.0000 mg | SUBCUTANEOUS | Status: DC
  Administered 2013-06-01: 70 mg via SUBCUTANEOUS
  Filled 2013-06-01 (×3): qty 0.8

## 2013-06-01 MED ORDER — ENSURE PUDDING PO PUDG
1.0000 | Freq: Three times a day (TID) | ORAL | Status: DC
  Administered 2013-06-02 – 2013-06-03 (×2): 1 via ORAL
  Filled 2013-06-01 (×11): qty 1

## 2013-06-01 NOTE — Progress Notes (Signed)
ANTICOAGULATION CONSULT NOTE - Initial Consult  Pharmacy Consult for enoxaparin Indication: atrial fibrillation  Allergies  Allergen Reactions  . Penicillins Rash    Patient Measurements: Height: 5\' 10"  (177.8 cm) Weight: 148 lb 2.4 oz (67.2 kg) IBW/kg (Calculated) : 73  Labs:  Recent Labs  06/01/13 0338  HGB 13.3  HCT 40.5  PLT 131*  CREATININE 2.00*    Estimated Creatinine Clearance: 26.1 ml/min (by C-G formula based on Cr of 2).   Medical History: Past Medical History  Diagnosis Date  . Prostate cancer     with radiation  . Bipolar 1 disorder   . Coronary artery disease   . Atrial fibrillation     persistent, with atypical atrial flutter  . Tachycardia-bradycardia     s/p PPM by Dr Verlon Setting, Gen change by Greggory Brandy 3/12  . Parkinson's disease   . Hypertension   . AAA (abdominal aortic aneurysm)     medical management  . Dementia     mild  . DVT (deep venous thrombosis)   . Hypoxemia 05/10/2012  . Sleep disorder 05/10/2012    Medications:  Scheduled:  . carbidopa-levodopa  1 tablet Oral TID  . lithium citrate  300 mg Oral QHS  . metoprolol tartrate  25 mg Oral BID  . OXcarbazepine  300 mg Oral BID  . Rivastigmine  1 patch Transdermal QHS   Infusions:  . sodium chloride 100 mL/hr at 06/01/13 1048    Assessment: 80 yo M admitted 4/29 from home with acute worsening of delirium with increased agitation, increased combativeness, difficulty following commands, sleep disturbance, poor oral intake. Pt on apixaban 2.5mg  PO BID for atrial fibrillation prior to admission. Pharmacy has now been consulted to dose enoxaparin for atrial fibrillation in a patient unable to take PO medications.  SCr 2.0 (baseline 1.5-1.7), CrCl 26 ml/min  CBC appears stable at patient's baseline, chronic mild thrombocytopenia noted  Last apixaban dose of 2.5mg  given on 4/29 @ 2234  No bleeding has been noted  Goal of Therapy:  Anti-Xa level 0.6-1 units/ml 4hrs after LMWH dose  given Monitor platelets by anticoagulation protocol: Yes   Plan:  - enoxaparin 70mg  SQ q24h to start 4/30 at 2200 (1mg /kg q24h) - monitor renal function to change to q12h dosing when/if appropriate - CBC at least q72h while inpatient - pharmacy will f/u daily  Thank you for the consult.  Johny Drilling, PharmD, BCPS Pager: [phone] Pharmacy: [phone] 06/01/2013 2:10 PM
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
## 2013-06-01 NOTE — Progress Notes (Signed)
Patient extremely confused and combative in the beginning of the night.  Family members, wife in particular were very anxious and demanding for MD to be notified because "nothing was working".  Patient had already been given ativan and haldol.  Dr. Dagmar Hait was called and orders received.  Haldol orders were modified and morphine was added.  Patient was bladder scanned and he was retaining 290 cc's urine so order for foley catheter was also received.  Around 2330 pm patient was finally able to sleep for a few hours.  Will continue to monitor.Jacob [name]

## 2013-06-01 NOTE — Progress Notes (Signed)
Subjective: Agitated overnight- finally slept after receiving Ativan, Haldol and MSO4.  Resting comfortably now.  RN inserted foley with ~400cc PVR.  Wife at bedside is pleased that he is sleeping.  Objective: Vital signs in last 24 hours: Temp:  [98 F (36.7 C)] 98 F (36.7 C) (04/29 1415) Pulse Rate:  [87] 87 (04/29 1415) Resp:  [20] 20 (04/29 1415) BP: (154)/(85) 154/85 mmHg (04/29 1415) Weight:  [67.2 kg (148 lb 2.4 oz)] 67.2 kg (148 lb 2.4 oz) (04/29 1415) Weight change:  Last BM Date: 05/29/13  CBG (last 3)  No results found for this basename: GLUCAP,  in the last 72 hours  Intake/Output from previous day: 04/29 0701 - 04/30 0700 In: 600 [I.V.:600] Out: -  Intake/Output this shift: Total I/O In: 600 [I.V.:600] Out: -   General appearance: sleeping soundly with normal respirations Eyes: no scleral icterus Throat: oropharynx moist without erythema Resp: clear to auscultation bilaterally Cardio: regular rate and rhythm GI: soft, non-tender; bowel sounds normal; no masses,  no organomegaly Extremities: no clubbing, cyanosis or edema   Lab Results:  Recent Labs  06/01/13 0338  NA 146  K 3.4*  CL 112  CO2 21  GLUCOSE 100*  BUN 37*  CREATININE 2.00*  CALCIUM 9.6   No results found for this basename: AST, ALT, ALKPHOS, BILITOT, PROT, ALBUMIN,  in the last 72 hours  Recent Labs  06/01/13 0338  WBC 10.8*  HGB 13.3  HCT 40.5  MCV 91.0  PLT 131*   Lab Results  Component Value Date   INR 1.8 12/15/2011   INR 2.3 11/03/2011   INR 2.2 10/06/2011   No results found for this basename: CKTOTAL, CKMB, CKMBINDEX, TROPONINI,  in the last 72 hours  Recent Labs  05/14/2013 1502  TSH 0.826   No results found for this basename: VITAMINB12, FOLATE, FERRITIN, TIBC, IRON, RETICCTPCT,  in the last 72 hours  Studies/Results: X-ray Chest Pa And Lateral   05/29/2013   CLINICAL DATA:  leukocytosis  EXAM: CHEST  2 VIEW  COMPARISON:  DG CHEST 2 VIEW dated 09/08/2012   FINDINGS: Low lung volumes. Cardiac silhouette within upper limits of normal. Atherosclerotic calcification the aorta. A left chest wall dual-chamber cardiac pacing unit. The consolidative mass like density adjacent to the aortic arch, described on previous chest radiograph, has increased in size when compared to the previous study. This finding is concerning for a mass and further evaluation with contrasted chest CT recommended. Linear area of increased density appreciated within the left lung base differential is considerations include scarring versus atelectasis. There is increased go density in the right peritracheal region. The small nodular density in the right mid lung slightly more prominent when compared to prior.  IMPRESSION: Findings concerning for masses in the right peritracheal and periaortic regions. Further evaluation with contrasted chest CT is recommended. These results will be called to the ordering clinician or representative by the Radiologist Assistant, and communication documented in the PACS Dashboard.  Slightly more prominent right mid lung pulmonary nodule  Atelectasis versus scarring right lung base.   Electronically Signed   By: Margaree Mackintosh M.D.   On: 06/01/2013 16:30     Medications: Scheduled: . apixaban  2.5 mg Oral BID  . carbidopa-levodopa  1 tablet Oral TID  . Lithium  300 mg Oral QHS  . metoprolol tartrate  25 mg Oral BID  . OXcarbazepine  300 mg Oral BID  . Rivastigmine  1 patch Transdermal QHS   Continuous: .  sodium chloride 100 mL/hr at 05/11/2013 1500    Assessment/Plan: Principal Problem: 1. Delirium- multifactorial secondary to Metabolic encephalopathy, Parkinson' with dementia and possible Mania in setting of bipolar.  Continue supportive care with Haldol, Ativan prn.  Continue IV fluid hydration.  Will repeat CT head.  I do not think he could cooperate for MRI without sedation.  Check EKG to monitor QTc with Haldol.  Active Problems: 2. BIPOLAR  AFFECTIVE DISORDER with ?acute mania- continue Lithium and Lamictal.  Awaiting psychiatry consult.   3. Atrial fibrillation- continue Lopressor and Eliquis (renally dosed). 4. Parkinson's disease dementia- continue Rivastigmine, Carbidopa (decreased dose).  Appreciate neurology assistance.   5. Acute renal failure- improving with IV fluids and foley placement consistent with prerenal azotemia +/- mild urinary retention. 6. Paratracheal pulmonary mass- discussed with wife.  He has a known pulmonary mass in 8/15 which wife elected not to pursue given his poor functional and cognitive status which would preclude aggressive treatment.  Will proceed with CT Chest (at same time as Head CT) for prognostic purposes.   7. Dispoosition- may need transfer to inpatient psychiatry/geriatric psych unit for stabilization if agitation persists. 8. DNR- I discussed with wife prior to admission and she desires DNR.   LOS: 1 day   Marton Redwood 06/01/2013, 6:27 AM

## 2013-06-01 NOTE — Clinical Documentation Improvement (Signed)
Possible Clinical Conditions?   Severe Malnutrition   Severe Protein Calorie Malnutrition Other Condition Cannot clinically determine   Risk Factors: (per 06/01/13 RD evaluation): Patient meets criteria for Severe Malnutrition in the context of chronic illness as evidenced by po intake <75% for > 1 month, moderate muscle wasting and subcutaneous fat loss.  Treatment: Recommend Ensure pudding po TID   Thank You, Theron Arista, Clinical Documentation Specialist:  [phone]  [facility]

## 2013-06-01 NOTE — Care Management Note (Signed)
CARE MANAGEMENT NOTE 06/01/2013  Patient:  Jacob Boyd, Jacob Boyd   Account Number:  [id number]  Date Initiated:  06/01/2013  Documentation initiated by:  Altheia Shafran  Subjective/Objective Assessment:   80 yo male admitted with delirium.     Action/Plan:   Discharged when medically stable. Awaiting psych eval   Anticipated DC Date:     Anticipated DC Plan:    In-house referral  Clinical Social Worker      DC Planning Services  CM consult      Choice offered to / List presented to:             Status of service:  In process, will continue to follow Medicare Important Message given?   (If response is "NO", the following Medicare IM given date fields will be blank) Date Medicare IM given:   Date Additional Medicare IM given:    Discharge Disposition:    Per UR Regulation:  Reviewed for med. necessity/level of care/duration of stay  If discussed at [place] of Stay Meetings, dates discussed:    Comments:  06/01/13 [place] [phone] Chart reviewed for utilization of services. Psych to eval to assist with dc planning. PTA pt from home with private caretakers.

## 2013-06-01 NOTE — Progress Notes (Signed)
Subjective: Slight improvement in degree of agitation most likely secondary to effects of sedating medication. No indication per family members have any improvement in extent of confusion.  Objective: Current vital signs: BP 141/66  Pulse 91  Temp(Src) 98.4 F (36.9 C) (Axillary)  Resp 20  Ht 5\' 10"  (1.778 m)  Wt 71.1 kg (156 lb 12 oz)  BMI 22.49 kg/m2  SpO2 98%  Neurologic Exam: Patient was alert and moderately agitated. There was no intelligible verbal output. There was no clear purposeful activity. He moved extremities equally with no signs of focal weakness.  CT scan of his head showed no acute intracranial abnormality. Global atrophy was noted with no signs of hydrocephalus.  Medications: I have reviewed the patient's current medications.  Assessment/Plan: Acute delirium most likely multifactorial with no clear improvement clinically, so far been slightly less agitation with sedating medications.  No changes in current management. We will continue to follow this patient with you.  C.R. Nicole Kindred, MD [facility] [phone]  06/01/2013  8:34 PM

## 2013-06-01 NOTE — Progress Notes (Signed)
INITIAL NUTRITION ASSESSMENT  DOCUMENTATION CODES Per approved criteria  -Moderate malnutrition in the context of chronic illness  Pt meets criteria for severe MALNUTRITION in the context of chronic illness as evidenced by PO intake <75% for > one month, moderate muscle wasting and subcutaneous fat loss.   INTERVENTION: -Recommend diet downgrade to Dys1/nectar thick -Recommend Ensure Pudding po TID, each supplement provides 170 kcal and 4 grams of protein -Consider SLP evaluation -Will continue to monitor  NUTRITION DIAGNOSIS: Inadequate oral intake related to AMS/difficulty swallowing as evidenced by PO intake <75%.   Goal: Pt to meet >/= 90% of their estimated nutrition needs    Monitor:  Total protein/energy intake, labs, weights, swallow profile  Reason for Assessment: MST  80 y.o. 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  -Noted pt often pocketing foods.  Can tolerate thin liquids; however caregiver noted pt may benefit from nectar thick  -Pt does not like Ensure/Boost, however may eat Ensure pudding  -Takes long time to eat meals, will eat 1-2 bites every hour. Eats small portions several times/day -Caregiver unsure if weight loss has occurred as she only has been with patient for 3 weeks -Pt has lost approximately 30 lbs in past 3-6 month per previous medical records -RN noted pt with non-blanching redness on sacrum -Nutrition Focused Physical Exam:  Subcutaneous Fat:  Orbital Region: WDL Upper Arm Region: moderate Thoracic and Lumbar Region: WDL  Muscle:  Temple Region: WDL Clavicle Bone Region:  WDL Clavicle and Acromion Bone Region: WDL Scapular Bone Region: n/a Dorsal Hand: n/a Patellar Region: moderate Anterior Thigh Region: n/a Posterior Calf Region: moderate  Edema: none noted    Height: Ht Readings from Last 1 Encounters:  05/25/2013 5\' 10"  (1.778 m)    Weight: Wt Readings from Last 1 Encounters:  05/27/2013 148 lb 2.4 oz (67.2 kg)    Ideal Body Weight: 166  % Ideal Body Weight: 89%  Wt Readings from Last 10 Encounters:  05/14/2013 148 lb 2.4 oz (67.2 kg)  03/15/13 182 lb (82.555 kg)  02/10/13 183 lb 9.6 oz (83.28 kg)  10/10/12 174 lb (78.926 kg)  10/07/12 177 lb 6.4 oz (80.468 kg)  09/08/12 178 lb 12.8 oz (81.103 kg)  08/03/12 169 lb 9.6 oz (76.93 kg)  06/16/12 181 lb (82.101 kg)  05/10/12 182 lb (82.555 kg)  05/02/12 182 lb (82.555 kg)    Usual Body Weight: 180 lbs per previous med records  % Usual Body Weight: 82%  BMI:  Body mass index is 21.26 kg/(m^2).  Estimated Nutritional Needs: Kcal: 1500-1700 Protein: 70-85 Fluid: >/=1700 ml/daily  Skin: nonblanching redness on sacrum  Diet Order: Dysphagia 3  EDUCATION NEEDS: -No education needs identified at this time   Intake/Output Summary (Last 24 hours) at 06/01/13 1451 Last data filed at 06/01/13 1443  Gross per 24 hour  Intake   1500 ml  Output   1200 ml  Net    300 ml    Last BM: 4/27   Labs:   Recent Labs Lab 06/01/13 0338  NA 146  K 3.4*  CL 112  CO2 21  BUN 37*  CREATININE 2.00*  CALCIUM 9.6  GLUCOSE 100*  CBG (last 3)  No results found for this basename: GLUCAP,  in the last 72 hours  Scheduled Meds: . carbidopa-levodopa  1 tablet Oral TID  . enoxaparin (LOVENOX) injection  70 mg Subcutaneous Q24H  . lithium citrate  300 mg Oral QHS  . metoprolol tartrate  25 mg Oral BID  . OXcarbazepine  300 mg Oral BID  . Rivastigmine  1 patch Transdermal QHS    Continuous Infusions: . sodium chloride 100 mL/hr at 06/01/13 1048    Past Medical History  Diagnosis  Date  . Prostate cancer     with radiation  . Bipolar 1 disorder   . Coronary artery disease   . Atrial fibrillation     persistent, with atypical atrial flutter  . Tachycardia-bradycardia     s/p PPM by Dr Verlon Setting, Gen change by Greggory Brandy 3/12  . Parkinson's disease   . Hypertension   . AAA (abdominal aortic aneurysm)     medical management  . Dementia     mild  . DVT (deep venous thrombosis)   . Hypoxemia 05/10/2012  . Sleep disorder 05/10/2012    Past Surgical History  Procedure Laterality Date  . Nasal sinus surgery    . Pacemaker insertion  04/08/2010    Initial pacemaker by Dr Verlon Setting, gen change by Greggory Brandy (MDT) 3/12  . Cataract extraction w/ intraocular lens  implant, bilateral      Atlee Abide MS RD LDN Clinical Dietitian [phone]:[phone]

## 2013-06-01 NOTE — Progress Notes (Signed)
Patient unable to take any PO medications due to sedation from Ativan and Haldol for agitation.

## 2013-06-01 NOTE — Consult Note (Signed)
[facility] Face-to-Face Psychiatry Consult   Reason for Consult:  Bipolar disorder Referring Physician:  Dr Dwaine Gale Jacob Boyd is an 80 y.o. male. Total Time spent with patient: 20 minutes  Assessment: AXIS I:  Delirium AXIS II:  Deferred AXIS III:   Past Medical History  Diagnosis Date  . Prostate cancer     with radiation  . Bipolar 1 disorder   . Coronary artery disease   . Atrial fibrillation     persistent, with atypical atrial flutter  . Tachycardia-bradycardia     s/p PPM by Dr Verlon Setting, Gen change by Greggory Brandy 3/12  . Parkinson's disease   . Hypertension   . AAA (abdominal aortic aneurysm)     medical management  . Dementia     mild  . DVT (deep venous thrombosis)   . Hypoxemia 05/10/2012  . Sleep disorder 05/10/2012   AXIS IV:  other psychosocial or environmental problems and problems related to social environment AXIS V:  31-40 impairment in reality testing  Plan:  Medication management  Subjective:   Jacob Boyd is a 80 y.o. male patient admitted with change in his mental status.  HPI:  Patient seen chart reviewed.  Patient is an 80 year old Caucasian man who has a history of Parkinson disease, dementia and bipolar disorder, admitted to the medical floor because of worsening in his symptoms, rapid decline and confusion.  Patient is unable to provide any information.  Most of the information was obtained from his wife and his son who were in the room.  Wife endorsed that there is a rapid decline in his level of functioning in past 5 weeks.  They have called his neurologist and his psychiatrist.  Patient is seeing Dr. Jeb Levering for many years for the management of bipolar disorder.  Recently lithium was added, Trileptal was decreased and Klonopin was increased however increase Klonopin makes him more week or and she had stopped given Klonopin.  Patient appears delirious and confused.  I reviewed his blood results.  His creatinine is high.  There were no lithium level done.  As per  wife patient is seeing psychiatrists for more than 15 years and he is very compliant with his treatment.  He has multiple hospitalization due to mania and psychosis.  Family denies any history of suicidal attempt in the past.  As mentioned above, patient appears very confused and he did not participate in conversation.   Past Psychiatric History: Past Medical History  Diagnosis Date  . Prostate cancer     with radiation  . Bipolar 1 disorder   . Coronary artery disease   . Atrial fibrillation     persistent, with atypical atrial flutter  . Tachycardia-bradycardia     s/p PPM by Dr Verlon Setting, Gen change by Greggory Brandy 3/12  . Parkinson's disease   . Hypertension   . AAA (abdominal aortic aneurysm)     medical management  . Dementia     mild  . DVT (deep venous thrombosis)   . Hypoxemia 05/10/2012  . Sleep disorder 05/10/2012    reports that he has never smoked. He has never used smokeless tobacco. He reports that he does not drink alcohol or use illicit drugs. Family History  Problem Relation Age of Onset  . Heart attack Mother   . Heart disease Father   . Heart disease Brother     Heart Disease before age 58  . Heart attack Brother      Living Arrangements: Spouse/significant other  Abuse/Neglect [facility]) Physical Abuse: Denies (per wife) Verbal Abuse: Denies Sexual Abuse: Denies Allergies:   Allergies  Allergen Reactions  . Penicillins Rash    ACT Assessment Complete:  No:   Past Psychiatric History: Patient has bipolar disorder.  He has admitted multiple times for mania.  He seen Dr. Nelda Marseille for more than 15 years.  Place of Residence:  Lives with his wife  Marital Status:  Married Employed/Unemployed:  Unemployed Family Supports:  Yes Objective: Blood pressure 141/66, pulse 91, temperature 98.4 F (36.9 C), temperature source Axillary, resp. rate 20, height 5' 10" (1.778 m), weight 148 lb 2.4 oz (67.2 kg), SpO2 98.00%.Body mass index is 21.26 kg/(m^2). Results for orders  placed during the hospital encounter of 05/22/2013 (from the past 72 hour(s))  TSH     Status: None   Collection Time    05/14/2013  3:02 PM      Result Value Ref Range   TSH 0.826  0.350 - 4.500 uIU/mL   Comment: Please note change in reference range.     Performed at [place], [place] MICROSCOPIC     Status: Abnormal   Collection Time    06/01/13  1:23 AM      Result Value Ref Range   Color, Urine YELLOW  YELLOW   APPearance CLOUDY (*) CLEAR   Specific Gravity, Urine 1.015  1.005 - 1.030   pH 5.0  5.0 - 8.0   Glucose, UA NEGATIVE  NEGATIVE mg/dL   Hgb urine dipstick NEGATIVE  NEGATIVE   Bilirubin Urine NEGATIVE  NEGATIVE   Ketones, ur NEGATIVE  NEGATIVE mg/dL   Protein, ur NEGATIVE  NEGATIVE mg/dL   Urobilinogen, UA 0.2  0.0 - 1.0 mg/dL   Nitrite NEGATIVE  NEGATIVE   Leukocytes, UA NEGATIVE  NEGATIVE   Comment: MICROSCOPIC NOT DONE ON URINES WITH NEGATIVE PROTEIN, BLOOD, LEUKOCYTES, NITRITE, OR GLUCOSE <1000 mg/dL.  BASIC METABOLIC PANEL     Status: Abnormal   Collection Time    06/01/13  3:38 AM      Result Value Ref Range   Sodium 146  137 - 147 mEq/L   Potassium 3.4 (*) 3.7 - 5.3 mEq/L   Chloride 112  96 - 112 mEq/L   CO2 21  19 - 32 mEq/L   Glucose, Bld 100 (*) 70 - 99 mg/dL   BUN 37 (*) 6 - 23 mg/dL   Creatinine, Ser 2.00 (*) 0.50 - 1.35 mg/dL   Calcium 9.6  8.4 - 10.5 mg/dL   GFR calc non Af Amer 29 (*) >90 mL/min   GFR calc Af Amer 34 (*) >90 mL/min   Comment: (NOTE)     The eGFR has been calculated using the CKD EPI equation.     This calculation has not been validated in all clinical situations.     eGFR's persistently <90 mL/min signify possible Chronic Kidney     Disease.  CBC     Status: Abnormal   Collection Time    06/01/13  3:38 AM      Result Value Ref Range   WBC 10.8 (*) 4.0 - 10.5 K/uL   RBC 4.45  4.22 - 5.81 MIL/uL   Hemoglobin 13.3  13.0 - 17.0 g/dL   HCT 40.5  39.0 - 52.0 %   MCV 91.0  78.0 - 100.0 fL   MCH 29.9   26.0 - 34.0 pg   MCHC 32.8  30.0 - 36.0 g/dL   RDW 13.0  11.5 - 15.5 %   Platelets 131 (*) 150 - 400 K/uL   Labs are reviewed and are pertinent for high creatinine..  Current Facility-Administered Medications  Medication Dose Route Frequency Provider Last Rate Last Dose  . 0.9 %  sodium chloride infusion   Intravenous Continuous Marton Redwood, MD 100 mL/hr at 06/01/13 1048    . acetaminophen (TYLENOL) tablet 650 mg  650 mg Oral Q6H PRN Marton Redwood, MD       Or  . acetaminophen (TYLENOL) suppository 650 mg  650 mg Rectal Q6H PRN Marton Redwood, MD      . alum & mag hydroxide-simeth (MAALOX/MYLANTA) 200-200-20 MG/5ML suspension 30 mL  30 mL Oral Q6H PRN Marton Redwood, MD      . carbidopa-levodopa (SINEMET IR) 25-100 MG per tablet immediate release 1 tablet  1 tablet Oral TID Marton Redwood, MD   1 tablet at 05/20/2013 2234  . enoxaparin (LOVENOX) injection 70 mg  70 mg Subcutaneous Q24H Mosetta Pigeon, [facility]      . feeding supplement (ENSURE) (ENSURE) pudding 1 Container  1 Container Oral TID BM Hazle Coca, RD      . haloperidol lactate (HALDOL) injection 2.5 mg  2.5 mg Intravenous Q6H PRN Ravisankar R Avva, MD   2.5 mg at 06/01/13 1557  . Lithium SOLN 300 mg  300 mg Oral QHS Marton Redwood, MD      . LORazepam (ATIVAN) injection 0.5 mg  0.5 mg Intravenous Q4H PRN Marton Redwood, MD   0.5 mg at 06/01/13 1506  . metoprolol tartrate (LOPRESSOR) tablet 25 mg  25 mg Oral BID Marton Redwood, MD   25 mg at 05/15/2013 2000  . morphine 2 MG/ML injection 2 mg  2 mg Intravenous Q2H PRN Ravisankar R Avva, MD   2 mg at 06/01/13 1031  . ondansetron (ZOFRAN) tablet 4 mg  4 mg Oral Q6H PRN Marton Redwood, MD       Or  . ondansetron [facility]) injection 4 mg  4 mg Intravenous Q6H PRN Marton Redwood, MD      . OXcarbazepine (TRILEPTAL) 300 MG/5ML suspension 300 mg  300 mg Oral BID Marton Redwood, MD   60 mg at 05/04/2013 2235  . polyethylene glycol (MIRALAX / GLYCOLAX) packet 17 g  17 g Oral Daily PRN Marton Redwood, MD      .  Rivastigmine PT24 13.3 mg  1 patch Transdermal QHS Marton Redwood, MD        Psychiatric Specialty Exam:     Blood pressure 141/66, pulse 91, temperature 98.4 F (36.9 C), temperature source Axillary, resp. rate 20, height 5' 10" (1.778 m), weight 148 lb 2.4 oz (67.2 kg), SpO2 98.00%.Body mass index is 21.26 kg/(m^2).  General Appearance: Confused and disoriented  Eye Contact::  None  Speech:  Slurred  Volume:  Decreased  Mood:  Irritable  Affect:  Labile  Thought Process:  Irrelevant, Loose and Tangential  Orientation:  NA  Thought Content:  Confusion and disorientation  Suicidal Thoughts:  Unable to assess   Homicidal Thoughts:  Unable to assess   Memory:  poor  Judgement:  Impaired  Insight:  Lacking  Psychomotor Activity:  Restlessness  Concentration:  Poor  Recall:  Poor  Fund of Knowledge:Poor  Language: Poor  Akathisia:  Restless and tremors  Handed:  Right  AIMS (if indicated):     Assets:  Social Support  Sleep:      Musculoskeletal: Strength & Muscle Tone: Unable to assess Gait & Station:  Patient is lying in the bed Patient leans: N/A  Treatment Plan Summary: Medication management, discontinue lithium and get lithium levell.  Patient has high creatinine and it is not safe to give lithium at this time.  Use low dose Haldol 0.5 mg to 1 mg IM for severe agitation.  Most likely delirium because of metabolic cause.  I have contact his psychiatrist Dr. Nelda Marseille and left a message but I have not heard from him.  Consultation liaison psychiatry will follow him.  Please call [phone] if you have any further questions.  Dossie Der T Evin Loiseau 06/01/2013 4:05 PM

## 2013-06-02 DIAGNOSIS — R9431 Abnormal electrocardiogram [ECG] [EKG]: Secondary | ICD-10-CM | POA: Diagnosis not present

## 2013-06-02 DIAGNOSIS — E870 Hyperosmolality and hypernatremia: Secondary | ICD-10-CM | POA: Diagnosis not present

## 2013-06-02 DIAGNOSIS — E440 Moderate protein-calorie malnutrition: Secondary | ICD-10-CM | POA: Diagnosis present

## 2013-06-02 DIAGNOSIS — R404 Transient alteration of awareness: Secondary | ICD-10-CM

## 2013-06-02 DIAGNOSIS — IMO0002 Reserved for concepts with insufficient information to code with codable children: Secondary | ICD-10-CM

## 2013-06-02 DIAGNOSIS — Z950 Presence of cardiac pacemaker: Secondary | ICD-10-CM | POA: Diagnosis present

## 2013-06-02 DIAGNOSIS — R451 Restlessness and agitation: Secondary | ICD-10-CM

## 2013-06-02 DIAGNOSIS — Z515 Encounter for palliative care: Secondary | ICD-10-CM

## 2013-06-02 LAB — CBC
HCT: 41.6 % (ref 39.0–52.0)
Hemoglobin: 14.2 g/dL (ref 13.0–17.0)
MCH: 31.1 pg (ref 26.0–34.0)
MCHC: 34.1 g/dL (ref 30.0–36.0)
MCV: 91 fL (ref 78.0–100.0)
Platelets: 137 10*3/uL — ABNORMAL LOW (ref 150–400)
RBC: 4.57 MIL/uL (ref 4.22–5.81)
RDW: 13.5 % (ref 11.5–15.5)
WBC: 14.1 10*3/uL — AB (ref 4.0–10.5)

## 2013-06-02 LAB — BASIC METABOLIC PANEL
BUN: 28 mg/dL — ABNORMAL HIGH (ref 6–23)
CALCIUM: 10.1 mg/dL (ref 8.4–10.5)
CO2: 19 mEq/L (ref 19–32)
CREATININE: 1.7 mg/dL — AB (ref 0.50–1.35)
Chloride: 117 mEq/L — ABNORMAL HIGH (ref 96–112)
GFR calc non Af Amer: 35 mL/min — ABNORMAL LOW (ref >90)
GFR, EST AFRICAN AMERICAN: 41 mL/min — AB (ref >90)
Glucose, Bld: 105 mg/dL — ABNORMAL HIGH (ref 70–99)
Potassium: 4.4 mEq/L (ref 3.7–5.3)
Sodium: 153 mEq/L — ABNORMAL HIGH (ref 137–147)

## 2013-06-02 LAB — URINE CULTURE
COLONY COUNT: NO GROWTH
Culture: NO GROWTH

## 2013-06-02 LAB — LITHIUM LEVEL: Lithium Lvl: 1.15 mEq/L (ref 0.80–1.40)

## 2013-06-02 MED ORDER — VALPROATE SODIUM 500 MG/5ML IV SOLN
100.0000 mg | Freq: Two times a day (BID) | INTRAVENOUS | Status: DC
  Administered 2013-06-02 – 2013-06-03 (×3): 100 mg via INTRAVENOUS
  Filled 2013-06-02 (×5): qty 1

## 2013-06-02 MED ORDER — SCOPOLAMINE 1 MG/3DAYS TD PT72
1.0000 | MEDICATED_PATCH | TRANSDERMAL | Status: DC
  Administered 2013-06-02: 1.5 mg via TRANSDERMAL
  Filled 2013-06-02: qty 1

## 2013-06-02 MED ORDER — DEXTROSE 5 % IV SOLN
INTRAVENOUS | Status: DC
  Administered 2013-06-02: 10:00:00 via INTRAVENOUS

## 2013-06-02 MED ORDER — QUETIAPINE FUMARATE 25 MG PO TABS
25.0000 mg | ORAL_TABLET | Freq: Two times a day (BID) | ORAL | Status: DC
  Filled 2013-06-02 (×2): qty 1

## 2013-06-02 MED ORDER — HALOPERIDOL LACTATE 5 MG/ML IJ SOLN
1.0000 mg | INTRAMUSCULAR | Status: DC | PRN
  Administered 2013-06-02: 1 mg via INTRAVENOUS
  Filled 2013-06-02: qty 1

## 2013-06-02 MED ORDER — BISACODYL 10 MG RE SUPP: 10.0000 mg | Freq: Every day | RECTAL | Status: DC | PRN

## 2013-06-02 MED ORDER — HALOPERIDOL LACTATE 5 MG/ML IJ SOLN
1.0000 mg | INTRAMUSCULAR | Status: DC | PRN
  Administered 2013-06-02 (×2): 1 mg via INTRAVENOUS
  Filled 2013-06-02 (×2): qty 1

## 2013-06-02 MED ORDER — HALOPERIDOL LACTATE 5 MG/ML IJ SOLN
1.0000 mg | INTRAMUSCULAR | Status: DC | PRN
  Administered 2013-06-02: 1 mg via INTRAVENOUS
  Filled 2013-06-02 (×2): qty 1

## 2013-06-02 MED ORDER — LITHIUM CITRATE 300 MG/5 ML PO SYRP
200.0000 mg | Freq: Every day | ORAL | Status: DC
  Filled 2013-06-02: qty 3.3

## 2013-06-02 MED ORDER — LORAZEPAM 2 MG/ML IJ SOLN
1.0000 mg | INTRAMUSCULAR | Status: DC | PRN
  Administered 2013-06-02 – 2013-06-04 (×5): 1 mg via INTRAVENOUS
  Filled 2013-06-02 (×5): qty 1

## 2013-06-02 MED ORDER — HALOPERIDOL LACTATE 5 MG/ML IJ SOLN
2.0000 mg | INTRAMUSCULAR | Status: DC | PRN
  Administered 2013-06-02 – 2013-06-03 (×8): 2 mg via INTRAVENOUS
  Filled 2013-06-02 (×8): qty 1

## 2013-06-02 NOTE — Progress Notes (Signed)
Clinical Social Work  CSW went to room to meet with patient and family. RN reports that PMT is meeting in order to complete [place] meeting. CSW will follow up at later time and will assist as needed with DC planning.  [place],  [phone]

## 2013-06-02 NOTE — Consult Note (Signed)
[facility] Face-to-Face Psychiatry Consult   Reason for Consult:  Bipolar disorder Referring Physician:  Dr Jacob Boyd is an 80 y.o. male. Total Time spent with patient: 20 minutes  Assessment: AXIS I:  Delirium AXIS II:  Deferred AXIS III:   Past Medical History  Diagnosis Date  . Prostate cancer     with radiation  . Bipolar 1 disorder   . Coronary artery disease   . Atrial fibrillation     persistent, with atypical atrial flutter  . Tachycardia-bradycardia     s/p PPM by Dr Jacob Boyd, Gen change by Jacob Boyd 3/12  . Parkinson's disease   . Hypertension   . AAA (abdominal aortic aneurysm)     medical management  . Dementia     mild  . DVT (deep venous thrombosis)   . Hypoxemia 05/10/2012  . Sleep disorder 05/10/2012   AXIS IV:  other psychosocial or environmental problems and problems related to social environment AXIS V:  31-40 impairment in reality testing  Plan:  Medication management  Subjective:   Jacob Boyd is a 80 y.o. male patient admitted with change in his mental status.  HPI:  Patient seen chart reviewed.  Patient is an 80 year old Caucasian man who has a history of Parkinson disease, dementia and bipolar disorder, admitted to the medical floor because of worsening in his symptoms, rapid decline and confusion.  Patient is unable to provide any information.  Most of the information was obtained from his wife and his son who were in the room.  Wife endorsed that there is a rapid decline in his level of functioning in past 5 weeks.  They have called his neurologist and his psychiatrist.  Patient is seeing Dr. Jeb Boyd for many years for the management of bipolar disorder.  Recently lithium was added, Trileptal was decreased and Klonopin was increased however increase Klonopin makes him more week or and she had stopped given Klonopin.  Patient appears delirious and confused.  I reviewed his blood results.  His creatinine is high.  There were no lithium level done.  As per  wife patient is seeing psychiatrists for more than 15 years and he is very compliant with his treatment.  He has multiple hospitalization due to mania and psychosis.  Family denies any history of suicidal attempt in the past.  As mentioned above, patient appears very confused and he did not participate in conversation.  Interval history: Patient was found in his bed snoring after received medication management at the Haldol, Ativan and morphine secondary to agitation as per the staff nurse report. Patient's son was at bedside has a limited information to contribute to his history at this time. Reportedly patient has been deteriorating functionally over a few weeks. Patient does not seem to be under distress at this time.    Past Psychiatric History: Past Medical History  Diagnosis Date  . Prostate cancer     with radiation  . Bipolar 1 disorder   . Coronary artery disease   . Atrial fibrillation     persistent, with atypical atrial flutter  . Tachycardia-bradycardia     s/p PPM by Dr Jacob Boyd, Gen change by Jacob Boyd 3/12  . Parkinson's disease   . Hypertension   . AAA (abdominal aortic aneurysm)     medical management  . Dementia     mild  . DVT (deep venous thrombosis)   . Hypoxemia 05/10/2012  . Sleep disorder 05/10/2012    reports that he has never smoked.  He has never used smokeless tobacco. He reports that he does not drink alcohol or use illicit drugs. Family History  Problem Relation Age of Onset  . Heart attack Mother   . Heart disease Father   . Heart disease Brother     Heart Disease before age 33  . Heart attack Brother      Living Arrangements: Spouse/significant other   Abuse/Neglect [facility]) Physical Abuse: Denies (per wife) Verbal Abuse: Denies Sexual Abuse: Denies Allergies:   Allergies  Allergen Reactions  . Penicillins Rash    ACT Assessment Complete:  No:   Past Psychiatric History: Patient has bipolar disorder.  He has admitted multiple times for mania.  He seen  Dr. Nelda Boyd for more than 15 years.  Place of Residence:  Lives with his wife  Marital Status:  Married Employed/Unemployed:  Unemployed Family Supports:  Yes Objective: Blood pressure 178/117, pulse 102, temperature 100.4 F (38 C), temperature source Axillary, resp. rate 20, height 5' 10"  (1.778 m), weight 71.1 kg (156 lb 12 oz), SpO2 97.00%.Body mass index is 22.49 kg/(m^2). Results for orders placed during the hospital encounter of 05/15/2013 (from the past 72 hour(s))  TSH     Status: None   Collection Time    05/28/2013  3:02 PM      Result Value Ref Range   TSH 0.826  0.350 - 4.500 uIU/mL   Comment: Please note change in reference range.     Performed at [facility]     Status: None   Collection Time    06/01/13  1:23 AM      Result Value Ref Range   Specimen Description URINE, CLEAN CATCH     Special Requests NONE     Culture  Setup Time       Value: 06/01/2013 03:32     Performed at [facility]       Value: NO GROWTH     Performed at [facility]   Culture       Value: NO GROWTH     Performed at [facility]   Report Status 06/02/2013 FINAL    URINALYSIS, ROUTINE W REFLEX MICROSCOPIC     Status: Abnormal   Collection Time    06/01/13  1:23 AM      Result Value Ref Range   Color, Urine YELLOW  YELLOW   APPearance CLOUDY (*) CLEAR   Specific Gravity, Urine 1.015  1.005 - 1.030   pH 5.0  5.0 - 8.0   Glucose, UA NEGATIVE  NEGATIVE mg/dL   Hgb urine dipstick NEGATIVE  NEGATIVE   Bilirubin Urine NEGATIVE  NEGATIVE   Ketones, ur NEGATIVE  NEGATIVE mg/dL   Protein, ur NEGATIVE  NEGATIVE mg/dL   Urobilinogen, UA 0.2  0.0 - 1.0 mg/dL   Nitrite NEGATIVE  NEGATIVE   Leukocytes, UA NEGATIVE  NEGATIVE   Comment: MICROSCOPIC NOT DONE ON URINES WITH NEGATIVE PROTEIN, BLOOD, LEUKOCYTES, NITRITE, OR GLUCOSE <1000 mg/dL.  BASIC METABOLIC PANEL     Status: Abnormal   Collection Time    06/01/13  3:38 AM      Result  Value Ref Range   Sodium 146  137 - 147 mEq/L   Potassium 3.4 (*) 3.7 - 5.3 mEq/L   Chloride 112  96 - 112 mEq/L   CO2 21  19 - 32 mEq/L   Glucose, Bld 100 (*) 70 - 99 mg/dL   BUN 37 (*)  6 - 23 mg/dL   Creatinine, Ser 2.00 (*) 0.50 - 1.35 mg/dL   Calcium 9.6  8.4 - 10.5 mg/dL   GFR calc non Af Amer 29 (*) >90 mL/min   GFR calc Af Amer 34 (*) >90 mL/min   Comment: (NOTE)     The eGFR has been calculated using the CKD EPI equation.     This calculation has not been validated in all clinical situations.     eGFR's persistently <90 mL/min signify possible Chronic Kidney     Disease.  CBC     Status: Abnormal   Collection Time    06/01/13  3:38 AM      Result Value Ref Range   WBC 10.8 (*) 4.0 - 10.5 K/uL   RBC 4.45  4.22 - 5.81 MIL/uL   Hemoglobin 13.3  13.0 - 17.0 g/dL   HCT 40.5  39.0 - 52.0 %   MCV 91.0  78.0 - 100.0 fL   MCH 29.9  26.0 - 34.0 pg   MCHC 32.8  30.0 - 36.0 g/dL   RDW 13.0  11.5 - 15.5 %   Platelets 131 (*) 150 - 400 K/uL  BASIC METABOLIC PANEL     Status: Abnormal   Collection Time    06/02/13  4:50 AM      Result Value Ref Range   Sodium 153 (*) 137 - 147 mEq/L   Potassium 4.4  3.7 - 5.3 mEq/L   Comment: DELTA CHECK NOTED     REPEATED TO VERIFY     NO VISIBLE HEMOLYSIS   Chloride 117 (*) 96 - 112 mEq/L   CO2 19  19 - 32 mEq/L   Glucose, Bld 105 (*) 70 - 99 mg/dL   BUN 28 (*) 6 - 23 mg/dL   Creatinine, Ser 1.70 (*) 0.50 - 1.35 mg/dL   Calcium 10.1  8.4 - 10.5 mg/dL   GFR calc non Af Amer 35 (*) >90 mL/min   GFR calc Af Amer 41 (*) >90 mL/min   Comment: (NOTE)     The eGFR has been calculated using the CKD EPI equation.     This calculation has not been validated in all clinical situations.     eGFR's persistently <90 mL/min signify possible Chronic Kidney     Disease.  CBC     Status: Abnormal   Collection Time    06/02/13  4:50 AM      Result Value Ref Range   WBC 14.1 (*) 4.0 - 10.5 K/uL   RBC 4.57  4.22 - 5.81 MIL/uL   Hemoglobin 14.2  13.0 -  17.0 g/dL   HCT 41.6  39.0 - 52.0 %   MCV 91.0  78.0 - 100.0 fL   MCH 31.1  26.0 - 34.0 pg   MCHC 34.1  30.0 - 36.0 g/dL   RDW 13.5  11.5 - 15.5 %   Platelets 137 (*) 150 - 400 K/uL  LITHIUM LEVEL     Status: None   Collection Time    06/02/13  4:50 AM      Result Value Ref Range   Lithium Lvl 1.15  0.80 - 1.40 mEq/L   Labs are reviewed and are pertinent for high creatinine..  Current Facility-Administered Medications  Medication Dose Route Frequency Provider Last Rate Last Dose  . acetaminophen (TYLENOL) suppository 650 mg  650 mg Rectal Q6H PRN Marton Redwood, MD      . bisacodyl (DULCOLAX) suppository 10 mg  10 mg  Rectal Daily PRN Knox Royalty, NP      . dextrose 5 % solution   Intravenous Continuous Knox Royalty, NP 10 mL/hr at 06/02/13 1321    . feeding supplement (ENSURE) (ENSURE) pudding 1 Container  1 Container Oral TID BM Hazle Coca, RD      . haloperidol lactate (HALDOL) injection 2 mg  2 mg Intravenous Q1H PRN Knox Royalty, NP   2 mg at 06/02/13 1807  . LORazepam (ATIVAN) injection 1 mg  1 mg Intravenous Q4H PRN Knox Royalty, NP   1 mg at 06/02/13 1444  . morphine 2 MG/ML injection 2 mg  2 mg Intravenous Q2H PRN Ravisankar R Avva, MD   2 mg at 06/02/13 1807  . ondansetron (ZOFRAN) injection 4 mg  4 mg Intravenous Q6H PRN Marton Redwood, MD      . Rivastigmine PT24 13.3 mg  1 patch Transdermal QHS Marton Redwood, MD   13.3 mg at 06/01/13 2342  . scopolamine (TRANSDERM-SCOP) 1 MG/3DAYS 1.5 mg  1 patch Transdermal Q72H Knox Royalty, NP   1.5 mg at 06/02/13 1700  . valproate (DEPACON) 100 mg in dextrose 5 % 50 mL IVPB  100 mg Intravenous Q12H Knox Royalty, NP        Psychiatric Specialty Exam:Patient was under medication sedation and unable to complete at this time and reviewed previous mental status examination has below.     Blood pressure 178/117, pulse 102, temperature 100.4 F (38 C), temperature source Axillary, resp. rate 20, height 5' 10"  (1.778 m), weight  71.1 kg (156 lb 12 oz), SpO2 97.00%.Body mass index is 22.49 kg/(m^2).  General Appearance: Confused and disoriented  Eye Contact::  None  Speech:  Slurred  Volume:  Decreased  Mood:  Irritable  Affect:  Labile  Thought Process:  Irrelevant, Loose and Tangential  Orientation:  NA  Thought Content:  Confusion and disorientation  Suicidal Thoughts:  Unable to assess   Homicidal Thoughts:  Unable to assess   Memory:  poor  Judgement:  Impaired  Insight:  Lacking  Psychomotor Activity:  Restlessness  Concentration:  Poor  Recall:  Poor  Fund of Knowledge:Poor  Language: Poor  Akathisia:  Restless and tremors  Handed:  Right  AIMS (if indicated):     Assets:  Social Support  Sleep:      Musculoskeletal: Strength & Muscle Tone: Unable to assess Gait & Station: Patient is lying in the bed Patient leans: N/A  Treatment Plan Summary: Medication management Patient lithium levell high normal therapeutic level and has multiple metabolic abnormalities. Patient has high creatinine and it is not safe to give lithium at this time.  May use low dose Haldol 0.5 mg to 34m IM for severe agitation.  Most likely delirium because of metabolic cause.   Consultation liaison psychiatry will follow him.   Please call [phone] you have any further questions.  JDurward Parcel5/02/2013 6:37 PM

## 2013-06-02 NOTE — Consult Note (Signed)
Patient [name]      DOB: [birth year]      VWU:[id number]     Consult Note from the Palliative Medicine Team at [place] Requested by: Jacob Boyd     PCP: Jacob Redwood, MD Reason for Consultation: Clarification of [place] and options    Phone [phone]  Assessment of patients Current state:   Continued physical, functional and cognitive decline over the past several weeks.  Acute encephalopathy with delirium most likely multifactorial; underlying dementia, bi-polar diagnosis, metabolic imbalances. Family faced with advanced directive decisions and anticipatory care needs.   Consult is for review of medical treatment options, clarification of goals of care and end of life issues, disposition and options, and symptom recommendation.  This NP Jacob Boyd reviewed medical records, received report from team, assessed the patient and then meet at the patient's bedside along with his wife, son Jacob Boyd and his wife to discuss diagnosis prognosis, GOC, EOL wishes disposition and options.  A detailed discussion was had today regarding advanced directives.  Concepts specific to code status, artifical feeding and hydration, continued IV antibiotics and rehospitalization was had.  The difference between a aggressive medical intervention path  and a palliative comfort care path for this patient at this time was had.  Values and goals of care important to patient and family were attempted to be elicited.  Concept of Hospice and Palliative Care were discussed  Natural trajectory and expectations at EOL were discussed.  Questions and concerns addressed.  Hard Choices booklet left for review. Family encouraged to call with questions or concerns.  PMT will continue to support holistically.   Goals of Care: 1.  Code Status:  DNR/DNI-comfort is main focus of care  Family verbalize a clear understanding that the patient's wishes would be for comfort if he could speak for  himself, as reflected in his advanced directives.   2. Scope of Treatment: 1. Vital Signs: daily  2. Respiratory/Oxygen:for comfort only 3. Nutritional Support/Tube Feeds:no artifical feeding now or in the future           -comfort sips and chips/diet as tolerated (with known risk of aspiration) 4. Antibiotics: none 5. Blood Products:none 6. IVF: KVO for medications only 7. Review of Medications to be discontinued:minimize for comfort, patient is unable to take oral agents at this time 8. Labs:none 9. Telemetry:none    3. Disposition: Monitor over the next 24 hours, reassess in the morning.  Jacob Boyd plans to meet with this family at 70 am for continued support and assistance with continued care planning and symptom management  With a full comfort path and continued decline,  overall poor prognosis, family is interested in [facility] referral.  4. Symptom Management:   1. Anxiety/Agitation:            Depacon IV 100 mg IV every 12 hrs            Haldol 1mg  IV every 2 hr prn, (discussed black box warning)            Ativan 1mg  IV every 4 hrs prn 2. Pain:  Morphine 1 mg IV every 1 hr prn 3. Bowel Regimen:   Dulcolax supp  5. Psychosocial: Emotional support offered to family at bedside   Patient Documents Completed or Given: Document Given Completed  Advanced Directives Pkt    MOST  yes  DNR    Gone from My Sight    Hard Choices yes  Brief HPI:  Jacob Boyd is an 80 y.o. male with a history of Parkinson's disease, dementia, and bipolar disorder  The first noticed a change 5 weeks ago when had trouble word-finding and seemed to be stuttering on his words. This was an acute change. Saw Jacob. Carles Boyd several days later who did lab work including lithium level, CBC, CMET, ammonia and Head CT which were normal. Per Jacob. Arturo Boyd not patient is non complinat with his Sinemet. He has been on lithium since the 1960's. He is on it for bipolar d/o. He has been on the atypical  antipsychotic medications in the past, but has been off of those for many years. On 05/04/13 patient was noted to not be doing as well, more confused and word finding difficulty. He had fell 5 times and was not sleeping well. Saw Jacob. Forde Boyd the following week. Referred to Jacob. Rexene Boyd for 2nd opinion who noted ( "He is written for Sinemet 2 pills 5 times a day but really is currently only taking 2 pills twice daily. It is possible that the Sinemet is causing him daytime somnolence and lightheadedness.  Psych was consulted.  At this time he is confused , agitated, unable to follow command.  He is unable to take any oral medications.  A CT Chest on 06-01-13 demonstrates an Anterior superior left upper lobe 3.7 x 3.2 x 3.8 cm mass about the  anterior pleural surface (cannot exclude invasion) and mediastinal  fat. Extending from the main mass are irregular smaller satellite  lesions with mild tenting of adjacent lung parenchyma. Findings  highly suspicious for malignancy. No adjacent adenopathy.  9 mm pleural-based nodule right lower lobe (series 7, image 32).  Question 3 mm nodule right lower lobe (series 7, image 34).  3 cm left lobe of thyroid lesion with extension into the upper  mediastinum.   White count is elevated 14.1. Focus of care is comfort   ROS: unable to illicit due to altered mental status   PMH:  Past Medical History  Diagnosis Date  . Prostate cancer     with radiation  . Bipolar 1 disorder   . Coronary artery disease   . Atrial fibrillation     persistent, with atypical atrial flutter  . Tachycardia-bradycardia     s/p PPM by Jacob Boyd, Gen change by Jacob Boyd 3/12  . Parkinson's disease   . Hypertension   . AAA (abdominal aortic aneurysm)     medical management  . Dementia     mild  . DVT (deep venous thrombosis)   . Hypoxemia 05/10/2012  . Sleep disorder 05/10/2012     PSH: Past Surgical History  Procedure Laterality Date  . Nasal sinus surgery    . Pacemaker insertion   04/08/2010    Initial pacemaker by Jacob Boyd, gen change by Jacob Boyd (MDT) 3/12  . Cataract extraction w/ intraocular lens  implant, bilateral     I have reviewed the FH and SH and  If appropriate update it with new information. Allergies  Allergen Reactions  . Penicillins Rash   Scheduled Meds: . carbidopa-levodopa  1 tablet Oral TID  . enoxaparin (LOVENOX) injection  70 mg Subcutaneous Q24H  . feeding supplement (ENSURE)  1 Container Oral TID BM  . lithium citrate  198 mg Oral QHS  . metoprolol tartrate  25 mg Oral BID  . OXcarbazepine  300 mg Oral BID  . Rivastigmine  1 patch Transdermal QHS   Continuous Infusions: . dextrose 100 mL/hr  at 06/02/13 1003   PRN Meds:.acetaminophen, acetaminophen, alum & mag hydroxide-simeth, haloperidol lactate, LORazepam, morphine injection, ondansetron (ZOFRAN) IV, ondansetron, polyethylene glycol    BP 158/71  Boyd 82  Temp(Src) 98.4 F (36.9 C) (Axillary)  Resp 22  Ht 5\' 10"  (1.778 m)  Wt 71.1 kg (156 lb 12 oz)  BMI 22.49 kg/m2  SpO2 97%   PPS:20 %    Intake/Output Summary (Last 24 hours) at 06/02/13 1113 Last data filed at 06/02/13 2683  Gross per 24 hour  Intake   2345 ml  Output   2675 ml  Net   -330 ml   LBM: 05/13/2013 per wife  Physical Exam:  General: restless, unable to follow commands, NAD HEENT:  Dry buccal membranes , no exudate Chest:   CTA CVS: RRR Abdomen:soft +BS Ext: without edema Neuro:agitated, restless, unable to rest  Labs: CBC    Component Value Date/Time   WBC 14.1* 06/02/2013 0450   RBC 4.57 06/02/2013 0450   HGB 14.2 06/02/2013 0450   HCT 41.6 06/02/2013 0450   PLT 137* 06/02/2013 0450   MCV 91.0 06/02/2013 0450   MCH 31.1 06/02/2013 0450   MCHC 34.1 06/02/2013 0450   RDW 13.5 06/02/2013 0450   LYMPHSABS 0.9 05/04/2013 1052   MONOABS 0.7 05/04/2013 1052   EOSABS 0.3 05/04/2013 1052   BASOSABS 0.1 05/04/2013 1052    BMET    Component Value Date/Time   NA 153* 06/02/2013 0450   K 4.4 06/02/2013 0450   CL 117*  06/02/2013 0450   CO2 19 06/02/2013 0450   GLUCOSE 105* 06/02/2013 0450   BUN 28* 06/02/2013 0450   CREATININE 1.70* 06/02/2013 0450   CREATININE 1.40* 05/04/2013 1052   CALCIUM 10.1 06/02/2013 0450   GFRNONAA 35* 06/02/2013 0450   GFRAA 41* 06/02/2013 0450    CMP     Component Value Date/Time   NA 153* 06/02/2013 0450   K 4.4 06/02/2013 0450   CL 117* 06/02/2013 0450   CO2 19 06/02/2013 0450   GLUCOSE 105* 06/02/2013 0450   BUN 28* 06/02/2013 0450   CREATININE 1.70* 06/02/2013 0450   CREATININE 1.40* 05/04/2013 1052   CALCIUM 10.1 06/02/2013 0450   PROT 6.2 05/04/2013 1052   ALBUMIN 4.1 05/04/2013 1052   AST 13 05/04/2013 1052   ALT <8 05/04/2013 1052   ALKPHOS 98 05/04/2013 1052   BILITOT 0.4 05/04/2013 1052   GFRNONAA 35* 06/02/2013 0450   GFRAA 41* 06/02/2013 0450     CT scan of the Chest: 06-01-13 .  Anterior superior left upper lobe 3.7 x 3.2 x 3.8 cm mass abuts the  anterior pleural surface (cannot exclude invasion) and mediastinal  fat. Extending from the main mass are irregular smaller satellite  lesions with mild tenting of adjacent lung parenchyma. Findings  highly suspicious for malignancy. No adjacent adenopathy.  9 mm pleural-based nodule right lower lobe (series 7, image 32).  Question 3 mm nodule right lower lobe (series 7, image 34).  3 cm left lobe of thyroid lesion with extension into the upper  mediastinum.  Sequential pacemaker is in place. Battery pack causes streak  artifact at the level of the left upper lobe mass.    Time In Time Out Total Time Spent with Patient Total Overall Time  1045 1200 75 min 80 min    Greater than 50%  of this time was spent counseling and coordinating care related to the above assessment and plan.   Jacob Lessen NP  Palliative  Medicine Team Team Phone # [phone] Pager [phone]  Call placed to Jacob Reynaldo Minium and his nurse called to tell me he is in agreement, family spoke directly to Jacob Boyd and he too is in agreement with a full comfort  path.

## 2013-06-02 NOTE — Progress Notes (Signed)
Pharmacy - Brief Note  QTc = 545. Received a total of 10mg  of haldol on 4/30. Consider limiting/reducing haldol and monitoring QTc closely to prevent [place].  Lithium level was ok, but drawn ~30 hours after the last dose.(last night's dose was not given). SCr has improved slightly. Consider reducing lithium to 200mg  qhs vs. Monitor closely as long as SCr cont to improve.  Romeo Rabon, PharmD, pager [phone]. 06/02/2013,8:12 AM.

## 2013-06-02 NOTE — Care Management Note (Signed)
Cm spoke with attending Dr.Shaw concerning pt's plan of care. Per Md plan is to continue to manage pt's medications to control current symptoms. If pt does not improve over the weekend, per MD possible full comfort measures. Per MD Palliative to consult. Will continue to follow.     Venita Lick Nene Aranas,MSN,RN [phone]

## 2013-06-02 NOTE — Progress Notes (Signed)
Md, patient still very restless and agitated overnight despite alternating morphine, ativan, and haldol as needed.  Patient still unable to take PO medications.  Upon assessment, I noted the obvious swelling of patient's right and left neck in addition to a swollen lymph node to patient's left pubic area. Last night I also applied soft mits to patients hands for safety (patient attempted pulling out foley overnight).  No other changes on third shift. Will continue to monitor.Seth Higginbotham [place]

## 2013-06-02 NOTE — Progress Notes (Signed)
Clinical Social Work  Per chart review, [place] meeting established that patient will be full comfort. Psych CSW is signing off but available if further needs arise or disposition plans change. Unit CSW aware of comfort care and will assist family.  [place], [place] [phone]

## 2013-06-02 NOTE — Progress Notes (Addendum)
Subjective: Patient remains agitated.  Brief improvement/sedation with Haldol but temporary.  Mumbles but no purposeful speech.  Objective: Vital signs in last 24 hours: Temp:  [98.1 F (36.7 C)-98.4 F (36.9 C)] 98.4 F (36.9 C) (05/01 0647) Pulse Rate:  [82-91] 82 (05/01 0647) Resp:  [20-22] 22 (05/01 0647) BP: (141-158)/(66-72) 158/71 mmHg (05/01 0647) SpO2:  [96 %-98 %] 97 % (05/01 0647) Weight:  [71.1 kg (156 lb 12 oz)] 71.1 kg (156 lb 12 oz) (04/30 2008) Weight change: 3.9 kg (8 lb 9.6 oz) Last BM Date: 05/29/13  CBG (last 3)  No results found for this basename: GLUCAP,  in the last 72 hours  Intake/Output from previous day: 04/30 0701 - 05/01 0700 In: 2345 [I.V.:2345] Out: 2175 [Urine:2175] Intake/Output this shift:    General appearance: awake, agitated, attempting to climb out of beds; mits on for patient safety Eyes: no scleral icterus Throat: oropharynx dry Resp: clear to auscultation bilaterally Cardio: regular rate and rhythm and grade 2/6 SEM GI: soft, non-tender; bowel sounds normal; no masses,  no organomegaly Extremities: no clubbing, cyanosis or edema   Lab Results:  Recent Labs  06/01/13 0338 06/02/13 0450  NA 146 153*  K 3.4* 4.4  CL 112 117*  CO2 21 19  GLUCOSE 100* 105*  BUN 37* 28*  CREATININE 2.00* 1.70*  CALCIUM 9.6 10.1   No results found for this basename: AST, ALT, ALKPHOS, BILITOT, PROT, ALBUMIN,  in the last 72 hours  Recent Labs  06/01/13 0338 06/02/13 0450  WBC 10.8* 14.1*  HGB 13.3 14.2  HCT 40.5 41.6  MCV 91.0 91.0  PLT 131* 137*   Lab Results  Component Value Date   INR 1.8 12/15/2011   INR 2.3 11/03/2011   INR 2.2 10/06/2011   No results found for this basename: CKTOTAL, CKMB, CKMBINDEX, TROPONINI,  in the last 72 hours  Recent Labs  05/20/2013 1502  TSH 0.826   No results found for this basename: VITAMINB12, FOLATE, FERRITIN, TIBC, IRON, RETICCTPCT,  in the last 72 hours  Studies/Results: X-ray Chest Pa And  Lateral   05/03/2013   CLINICAL DATA:  leukocytosis  EXAM: CHEST  2 VIEW  COMPARISON:  DG CHEST 2 VIEW dated 09/08/2012  FINDINGS: Low lung volumes. Cardiac silhouette within upper limits of normal. Atherosclerotic calcification the aorta. A left chest wall dual-chamber cardiac pacing unit. The consolidative mass like density adjacent to the aortic arch, described on previous chest radiograph, has increased in size when compared to the previous study. This finding is concerning for a mass and further evaluation with contrasted chest CT recommended. Linear area of increased density appreciated within the left lung base differential is considerations include scarring versus atelectasis. There is increased go density in the right peritracheal region. The small nodular density in the right mid lung slightly more prominent when compared to prior.  IMPRESSION: Findings concerning for masses in the right peritracheal and periaortic regions. Further evaluation with contrasted chest CT is recommended. These results will be called to the ordering clinician or representative by the Radiologist Assistant, and communication documented in the PACS Dashboard.  Slightly more prominent right mid lung pulmonary nodule  Atelectasis versus scarring right lung base.   Electronically Signed   By: Margaree Mackintosh M.D.   On: 05/19/2013 16:30   Ct Head Wo Contrast  06/01/2013   CLINICAL DATA:  Altered mental status. Agitated. Parkinson's disease.  EXAM: CT HEAD WITHOUT CONTRAST  TECHNIQUE: Contiguous axial images were obtained from the  base of the skull through the vertex without intravenous contrast.  COMPARISON:  05/04/2013.  FINDINGS: No intracranial hemorrhage.  Small vessel disease type changes without CT evidence of large acute infarct.  No intracranial mass lesion noted on this unenhanced exam.  Vascular calcifications.  Global atrophy without hydrocephalus.  IMPRESSION: No acute abnormality.  Please see above.   Electronically  Signed   By: Chauncey Cruel M.D.   On: 06/01/2013 10:54   Ct Chest Wo Contrast  06/01/2013   CLINICAL DATA:  Abnormal chest x-ray.  Prostate cancer.  EXAM: CT CHEST WITHOUT CONTRAST  TECHNIQUE: Multidetector CT imaging of the chest was performed following the standard protocol without IV contrast.  COMPARISON:  05/15/2013, 09/08/2012 and 08/11/2011 chest x-ray. No comparison chest CT.  FINDINGS: Patient moved.  Images repeated.  Anterior superior left upper lobe 3.7 x 3.2 x 3.8 cm mass abuts the anterior pleural surface (cannot exclude invasion) and mediastinal fat. Extending from the main mass are irregular smaller satellite lesions with mild tenting of adjacent lung parenchyma. Findings highly suspicious for malignancy. No adjacent adenopathy.  9 mm pleural-based nodule right lower lobe (series 7, image 32). Question 3 mm nodule right lower lobe (series 7, image 34).  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  3 cm left lobe of thyroid lesion with extension into the upper mediastinum.  Sequential pacemaker is in place. Battery pack causes streak artifact at the level of the left upper lobe mass.  Heart size top-normal.  Coronary artery calcifications.  Atherosclerotic type changes thoracic aorta most notable involving the descending thoracic aorta are.  Only portion of the right adrenal gland is visualized and unremarkable. Left adrenal gland not visualized. No obvious liver lesion. Entire liver not imaged.  Degenerative changes thoracic spine without obvious bony destructive lesion. Evaluation limited by the degree of motion.  IMPRESSION: Patient moved.  Images repeated.  Anterior superior left upper lobe 3.7 x 3.2 x 3.8 cm mass abuts the anterior pleural surface (cannot exclude invasion) and mediastinal fat. Extending from the main mass are irregular smaller satellite lesions with mild tenting of adjacent lung parenchyma. Findings highly suspicious for malignancy. No adjacent adenopathy.  9 mm  pleural-based nodule right lower lobe (series 7, image 32). Question 3 mm nodule right lower lobe (series 7, image 34).  3 cm left lobe of thyroid lesion with extension into the upper mediastinum.  Sequential pacemaker is in place. Battery pack causes streak artifact at the level of the left upper lobe mass.   Electronically Signed   By: Chauncey Cruel M.D.   On: 06/01/2013 12:14     Medications: Scheduled: . carbidopa-levodopa  1 tablet Oral TID  . enoxaparin (LOVENOX) injection  70 mg Subcutaneous Q24H  . feeding supplement (ENSURE)  1 Container Oral TID BM  . lithium citrate  300 mg Oral QHS  . metoprolol tartrate  25 mg Oral BID  . OXcarbazepine  300 mg Oral BID  . Rivastigmine  1 patch Transdermal QHS   Continuous: . sodium chloride 100 mL/hr at 06/02/13 9833    Assessment/Plan: Principal Problem: 1. Delirium- multifactorial secondary to metabolic encephalopathy, malnutrition with volume depletion, bipolar disorder, and Parkinson's with dementia. He is not improving with IV fluid hydration despite improvement in his renal function. Prolonged QTC limits Haldol dose. Discussed at length with his wife and son. They would prefer for him to be comfortable and understand potential risk of sudden cardiac event with possible torsades with continued Haldol dose. We'll  try decreasing Haldol to 1 mg IV every 4 hours to see if it is effective. If not, we'll need to increase the dose and consider Morphine for patient comfort. We discussed his prognosis at length and his wife is interested in transitioning to a comfort approach if no improvement. She does not want him to go to an inpatient psychiatry facility.  Will continue attempts to manage his agitation with Haldol, Ativan, and IV fluids. I have tried to call Dr. Casimiro Needle, his psychiatrist who is out of the office today. We'll consult palliative care medicine to assist with symptom management and possible transition to hospice care.  Active  Problems: 2. BIPOLAR AFFECTIVE DISORDER- continue Trileptal and lithium as able to take pos. Recheck lithium level after several doses. 3. Atrial fibrillation-continue Lopressor and Eliquis was for now but low threshold to discontinue if no improvement 4. Parkinson's disease with dementia-he has not received his carbidopa. Will continue to try to administer as able. 5. Acute renal failure-improving with IV fluid hydration. 6. Pulmonary mass-CT the chest is concerning for malignancy. We discussed the high likelihood of this as well as the fact that he is not a candidate for any treatment. I reviewed with his wife that we had discussed the possibility of malignancy in 8/14 but given his poor functional status did not feel that he was a candidate for treatment so no additional evaluation was performed at that time. 7. Malnutrition of moderate degree-wife asked if TPN was appropriate. We discussed this and agreed to prolong his life with artificial nutrition would not be in his best interest. 8. Cardiac pacemaker in situ with Prolonged Q-T interval on ECG-increased risk of or sides on Haldol. We'll try lower dose.  9. Hypernatremia-change IV fluids to D5W and continue hydration.    LOS: 2 days   Jacob Boyd 06/02/2013, 9:03 AM   I discussed with Dr. Casimiro Needle who recommended that we discontinue Trileptal and use Seroquel 25mg  BID in addition to Haldol.  Discussed with [name].  Agree with transition to comfort care/Hospice care if she desires.

## 2013-06-02 NOTE — Progress Notes (Signed)
NEURO HOSPITALIST PROGRESS NOTE   SUBJECTIVE:                                                                                                                         Patient is resting comfortably. Family has chosen to go with palliative comfort care only.  OBJECTIVE:                                                                                                                           Vital signs in last 24 hours: Temp:  [98.1 F (36.7 C)-100.4 F (38 C)] 100.4 F (38 C) (05/01 1409) Pulse Rate:  [82-102] 102 (05/01 1409) Resp:  [20-22] 20 (05/01 1409) BP: (150-178)/(71-117) 178/117 mmHg (05/01 1409) SpO2:  [96 %-97 %] 97 % (05/01 1409) Weight:  [71.1 kg (156 lb 12 oz)] 71.1 kg (156 lb 12 oz) (04/30 2008)  Intake/Output from previous day: 04/30 0701 - 05/01 0700 In: 2345 [I.V.:2345] Out: 2175 [Urine:2175] Intake/Output this shift: Total I/O In: 318.3 [I.V.:318.3] Out: 1100 [Urine:1100] Nutritional status: Dysphagia  Past Medical History  Diagnosis Date  . Prostate cancer     with radiation  . Bipolar 1 disorder   . Coronary artery disease   . Atrial fibrillation     persistent, with atypical atrial flutter  . Tachycardia-bradycardia     s/p PPM by Dr Verlon Setting, Gen change by Greggory Brandy 3/12  . Parkinson's disease   . Hypertension   . AAA (abdominal aortic aneurysm)     medical management  . Dementia     mild  . DVT (deep venous thrombosis)   . Hypoxemia 05/10/2012  . Sleep disorder 05/10/2012      Neurologic Exam:  Mental Status: Resting comfortably. Cranial Nerves: Face symmetrical  Motor: Moving all extremities spontaneously and purposefully    Lab Results: Basic Metabolic Panel:  Recent Labs Lab 06/01/13 0338 06/02/13 0450  NA 146 153*  K 3.4* 4.4  CL 112 117*  CO2 21 19  GLUCOSE 100* 105*  BUN 37* 28*  CREATININE 2.00* 1.70*  CALCIUM 9.6 10.1    Liver Function Tests: No results found for this basename: AST,  ALT, ALKPHOS, BILITOT, PROT, ALBUMIN,  in the last 168 hours No results  found for this basename: LIPASE, AMYLASE,  in the last 168 hours No results found for this basename: AMMONIA,  in the last 168 hours  CBC:  Recent Labs Lab 06/01/13 0338 06/02/13 0450  WBC 10.8* 14.1*  HGB 13.3 14.2  HCT 40.5 41.6  MCV 91.0 91.0  PLT 131* 137*    Cardiac Enzymes: No results found for this basename: CKTOTAL, CKMB, CKMBINDEX, TROPONINI,  in the last 168 hours  Lipid Panel: No results found for this basename: CHOL, TRIG, HDL, CHOLHDL, VLDL, LDLCALC,  in the last 168 hours  CBG: No results found for this basename: GLUCAP,  in the last 168 hours  Microbiology: Results for orders placed during the hospital encounter of 05/28/2013  URINE CULTURE     Status: None   Collection Time    06/01/13  1:23 AM      Result Value Ref Range Status   Specimen Description URINE, CLEAN CATCH   Final   Special Requests NONE   Final   Culture  Setup Time     Final   Value: 06/01/2013 03:32     Performed at [facility]     Final   Value: NO GROWTH     Performed at [facility]   Culture     Final   Value: NO GROWTH     Performed at [facility]   Report Status 06/02/2013 FINAL   Final    Coagulation Studies: No results found for this basename: LABPROT, INR,  in the last 72 hours  Imaging: Ct Head Wo Contrast  06/01/2013   CLINICAL DATA:  Altered mental status. Agitated. Parkinson's disease.  EXAM: CT HEAD WITHOUT CONTRAST  TECHNIQUE: Contiguous axial images were obtained from the base of the skull through the vertex without intravenous contrast.  COMPARISON:  05/04/2013.  FINDINGS: No intracranial hemorrhage.  Small vessel disease type changes without CT evidence of large acute infarct.  No intracranial mass lesion noted on this unenhanced exam.  Vascular calcifications.  Global atrophy without hydrocephalus.  IMPRESSION: No acute abnormality.  Please see above.    Electronically Signed   By: Chauncey Cruel M.D.   On: 06/01/2013 10:54   Ct Chest Wo Contrast  06/01/2013   CLINICAL DATA:  Abnormal chest x-ray.  Prostate cancer.  EXAM: CT CHEST WITHOUT CONTRAST  TECHNIQUE: Multidetector CT imaging of the chest was performed following the standard protocol without IV contrast.  COMPARISON:  05/26/2013, 09/08/2012 and 08/11/2011 chest x-ray. No comparison chest CT.  FINDINGS: Patient moved.  Images repeated.  Anterior superior left upper lobe 3.7 x 3.2 x 3.8 cm mass abuts the anterior pleural surface (cannot exclude invasion) and mediastinal fat. Extending from the main mass are irregular smaller satellite lesions with mild tenting of adjacent lung parenchyma. Findings highly suspicious for malignancy. No adjacent adenopathy.  9 mm pleural-based nodule right lower lobe (series 7, image 32). Question 3 mm nodule right lower lobe (series 7, image 34).  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  3 cm left lobe of thyroid lesion with extension into the upper mediastinum.  Sequential pacemaker is in place. Battery pack causes streak artifact at the level of the left upper lobe mass.  Heart size top-normal.  Coronary artery calcifications.  Atherosclerotic type changes thoracic aorta most notable involving the descending thoracic aorta are.  Only portion of the right adrenal gland is visualized and unremarkable. Left adrenal gland not visualized. No obvious liver lesion.  Entire liver not imaged.  Degenerative changes thoracic spine without obvious bony destructive lesion. Evaluation limited by the degree of motion.  IMPRESSION: Patient moved.  Images repeated.  Anterior superior left upper lobe 3.7 x 3.2 x 3.8 cm mass abuts the anterior pleural surface (cannot exclude invasion) and mediastinal fat. Extending from the main mass are irregular smaller satellite lesions with mild tenting of adjacent lung parenchyma. Findings highly suspicious for malignancy. No adjacent adenopathy.  9  mm pleural-based nodule right lower lobe (series 7, image 32). Question 3 mm nodule right lower lobe (series 7, image 34).  3 cm left lobe of thyroid lesion with extension into the upper mediastinum.  Sequential pacemaker is in place. Battery pack causes streak artifact at the level of the left upper lobe mass.   Electronically Signed   By: Chauncey Cruel M.D.   On: 06/01/2013 12:14       MEDICATIONS                                                                                                                        Scheduled: . feeding supplement (ENSURE)  1 Container Oral TID BM  . Rivastigmine  1 patch Transdermal QHS  . scopolamine  1 patch Transdermal Q72H  . valproate sodium  100 mg Intravenous Q12H    ASSESSMENT/PLAN:                                                                                                            Acute delirium most likely multifactorial. Given recent decline and CT chest concerning for malignancy discussion was had about  the fact that he is not a candidate for any treatment. Family has chosen to go with palliative comfort care.    Neurology will S/O   Assessment and plan discussed with with attending physician and they are in agreement.    Etta Quill PA-C [facility] [phone]  06/02/2013, 5:23 PM

## 2013-06-02 NOTE — Progress Notes (Signed)
Son at bedside. Pt sleeping. Turned and repositioned q 3 hr.

## 2013-06-02 DEATH — deceased

## 2013-06-03 DIAGNOSIS — F319 Bipolar disorder, unspecified: Secondary | ICD-10-CM

## 2013-06-03 MED ORDER — BIOTENE DRY MOUTH MT LIQD: 15.0000 mL | Freq: Two times a day (BID) | OROMUCOSAL | Status: DC

## 2013-06-03 MED ORDER — CHLORHEXIDINE GLUCONATE 0.12 % MT SOLN
15.0000 mL | Freq: Two times a day (BID) | OROMUCOSAL | Status: DC
  Administered 2013-06-04: 15 mL via OROMUCOSAL
  Filled 2013-06-03 (×3): qty 15

## 2013-06-03 NOTE — Progress Notes (Signed)
Patient Jacob Boyd      DOB: [birth year]      KKD:[id number]   Palliative Medicine Team at [facility] Progress Note    Subjective: Met with patient's wife Jacob Boyd, 3 sons and DIL.  Patient rested better over night on new medication regime- haldol, morphine , and valproic acid.  Patient has used valproic acid in the past and Jacob Boyd did not mention any adverse affects that she recalled.  Family anxious to have this process complete itself for the sake of Jacob Boyd.  They were pleased that he was less agitated and really were looking for answers to the when and what of his last days.  He wants to be sent to [facility] for use by the medical students at his time of death and so I have asked that Jacob Boyd provides [place] with these documents.  Filed Vitals:   06/03/13 0656  BP: 158/62  Pulse: 89  Temp: 99.2 F (37.3 C)  Resp: 20   Physical exam:  Generally: somnolent but start to fidget Pupils not examined Chest decreased with rhonchi CVS: bounding irregular heart rate, 2/6 SEM ABd soft, not distended Ext: warm, no mottling Neuro: sedated due to excessive life limiting agitation.   Assessment and plan: 80 yr old with history of bipolar disorder admitted with agitation and overall decline.  Patient with history of Parkinson's , Dementia and bipolar disorder.  His out patient psychiatric medications had been adjusted at home with no real improvement in agitated behavior .  He was admitted and symptom management with psych and neuro evals initiated. CT chest completed to look at lung mass, may represent bronchogenic carcinoma. Family has elected full comfort care with aggressive symptom management.  At this time ,  Patient requiring frequent IV medications to maintain comfort. His prognosis is days to week especially if not eating drinking. If patient stable for transer , family has connections to [employer].  There preference at this point would be to stay put .  1.  DNR, comfort care   2.   Terminal agitation:  Continue prn haldol.  Have room to up titrate valproic acid if persistent breakthrough agitation.  Next dose recommendation would be 250 mg q 12.  Agree with continued Benzo.  3.  Comfort feed only if awake.  4.  Prognosis : hours to days   Total time 1000 am - 1040 am   Jacob Valade L. Lovena Le, MD MBA The Palliative Medicine Team at [facility] Phone: [phone] Pager: [phone]

## 2013-06-03 NOTE — Progress Notes (Signed)
Subjective: Patient is resting comfortably eyes closed not responsive son at the bedside. He appears quite comfortable.  Objective: Vital signs in last 24 hours: Temp:  [99.2 F (37.3 C)-100.4 F (38 C)] 99.2 F (37.3 C) (05/02 0656) Pulse Rate:  [89-102] 89 (05/02 0656) Resp:  [20] 20 (05/02 0656) BP: (148-178)/(58-117) 158/62 mmHg (05/02 0656) SpO2:  [94 %-97 %] 94 % (05/02 0656) Weight change:   CBG (last 3)  No results found for this basename: GLUCAP,  in the last 72 hours  Intake/Output from previous day: 05/01 0701 - 05/02 0700 In: 318.3 [I.V.:318.3] Out: 1500 [Urine:1500]  Physical Exam: Somnolent comfortable no distress No JVD or bruits Lungs with few scattered rhonchi Heart sounds regular 2/6 systolic ejection murmur Abdomen is benign Neurologically not responsive movement of extremities x0   Lab Results:  Recent Labs  06/01/13 0338 06/02/13 0450  NA 146 153*  K 3.4* 4.4  CL 112 117*  CO2 21 19  GLUCOSE 100* 105*  BUN 37* 28*  CREATININE 2.00* 1.70*  CALCIUM 9.6 10.1   No results found for this basename: AST, ALT, ALKPHOS, BILITOT, PROT, ALBUMIN,  in the last 72 hours  Recent Labs  06/01/13 0338 06/02/13 0450  WBC 10.8* 14.1*  HGB 13.3 14.2  HCT 40.5 41.6  MCV 91.0 91.0  PLT 131* 137*   Lab Results  Component Value Date   INR 1.8 12/15/2011   INR 2.3 11/03/2011   INR 2.2 10/06/2011   No results found for this basename: CKTOTAL, CKMB, CKMBINDEX, TROPONINI,  in the last 72 hours  Recent Labs  05/24/2013 1502  TSH 0.826   No results found for this basename: VITAMINB12, FOLATE, FERRITIN, TIBC, IRON, RETICCTPCT,  in the last 72 hours  Studies/Results: Ct Head Wo Contrast  06/01/2013   CLINICAL DATA:  Altered mental status. Agitated. Parkinson's disease.  EXAM: CT HEAD WITHOUT CONTRAST  TECHNIQUE: Contiguous axial images were obtained from the base of the skull through the vertex without intravenous contrast.  COMPARISON:  05/04/2013.   FINDINGS: No intracranial hemorrhage.  Small vessel disease type changes without CT evidence of large acute infarct.  No intracranial mass lesion noted on this unenhanced exam.  Vascular calcifications.  Global atrophy without hydrocephalus.  IMPRESSION: No acute abnormality.  Please see above.   Electronically Signed   By: Chauncey Cruel M.D.   On: 06/01/2013 10:54   Ct Chest Wo Contrast  06/01/2013   CLINICAL DATA:  Abnormal chest x-ray.  Prostate cancer.  EXAM: CT CHEST WITHOUT CONTRAST  TECHNIQUE: Multidetector CT imaging of the chest was performed following the standard protocol without IV contrast.  COMPARISON:  05/21/2013, 09/08/2012 and 08/11/2011 chest x-ray. No comparison chest CT.  FINDINGS: Patient moved.  Images repeated.  Anterior superior left upper lobe 3.7 x 3.2 x 3.8 cm mass abuts the anterior pleural surface (cannot exclude invasion) and mediastinal fat. Extending from the main mass are irregular smaller satellite lesions with mild tenting of adjacent lung parenchyma. Findings highly suspicious for malignancy. No adjacent adenopathy.  9 mm pleural-based nodule right lower lobe (series 7, image 32). Question 3 mm nodule right lower lobe (series 7, image 34).  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  3 cm left lobe of thyroid lesion with extension into the upper mediastinum.  Sequential pacemaker is in place. Battery pack causes streak artifact at the level of the left upper lobe mass.  Heart size top-normal.  Coronary artery calcifications.  Atherosclerotic type changes thoracic  aorta most notable involving the descending thoracic aorta are.  Only portion of the right adrenal gland is visualized and unremarkable. Left adrenal gland not visualized. No obvious liver lesion. Entire liver not imaged.  Degenerative changes thoracic spine without obvious bony destructive lesion. Evaluation limited by the degree of motion.  IMPRESSION: Patient moved.  Images repeated.  Anterior superior left upper  lobe 3.7 x 3.2 x 3.8 cm mass abuts the anterior pleural surface (cannot exclude invasion) and mediastinal fat. Extending from the main mass are irregular smaller satellite lesions with mild tenting of adjacent lung parenchyma. Findings highly suspicious for malignancy. No adjacent adenopathy.  9 mm pleural-based nodule right lower lobe (series 7, image 32). Question 3 mm nodule right lower lobe (series 7, image 34).  3 cm left lobe of thyroid lesion with extension into the upper mediastinum.  Sequential pacemaker is in place. Battery pack causes streak artifact at the level of the left upper lobe mass.   Electronically Signed   By: Chauncey Cruel M.D.   On: 06/01/2013 12:14     Assessment/Plan: #1 toxic metabolic encephalopathy multifactorial to include nutrition, Parkinson's dementia, bipolar disorder currently converted to palliative care goals being met at this time  #2 pulmonary mass compatible with bronchogenic carcinoma no intervention  #3 cardiac pacemaker in situ  #4 acute renal failure secondary volume depletion  #5 atrial fibrillation  Has shifted to comfort care son at the bedside questions answered   LOS: 3 days   Geoffery Lyons 06/03/2013, 9:09 AM

## 2013-06-03 NOTE — Consult Note (Signed)
I have reviewed this case with our NP and agree with the Assessment and Plan as stated.  Marykate Heuberger L. Nazim Kadlec, MD MBA The Palliative Medicine Team at [place] Team Phone: [phone] Pager: [phone]   

## 2013-06-04 DIAGNOSIS — R0989 Other specified symptoms and signs involving the circulatory and respiratory systems: Secondary | ICD-10-CM

## 2013-06-04 DIAGNOSIS — R0609 Other forms of dyspnea: Secondary | ICD-10-CM

## 2013-06-04 MED ORDER — MORPHINE SULFATE 10 MG/ML IJ SOLN
0.5000 mg/h | INTRAVENOUS | Status: DC
  Administered 2013-06-04: 0.5 mg/h via INTRAVENOUS
  Filled 2013-06-04: qty 10

## 2013-06-04 MED ORDER — MORPHINE BOLUS VIA INFUSION
1.0000 mg | INTRAVENOUS | Status: DC | PRN
  Filled 2013-06-04: qty 1

## 2013-06-06 ENCOUNTER — Telehealth: Payer: Self-pay | Admitting: Neurology

## 2013-06-06 NOTE — Telephone Encounter (Signed)
Called pts wife to express my condolences.  Pts wife expressed frustration that he wasn't in hospice prior to death.  Again apologized to wife and told her to let us know if there is anything that we could do for her.
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
## 2013-07-03 NOTE — Progress Notes (Signed)
Morphine GTT 100 ml wasted in sink with Glori Luis.

## 2013-07-03 NOTE — Progress Notes (Signed)
Patient without pulse or respirations and pronounced this morning. I had discussions with Dr. Lovena Le a palliative care patient was comfortable with end-of-life expected., My visit both wife and son were at the bedside, condolences offered, no needs identified.

## 2013-07-03 NOTE — Progress Notes (Signed)
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  His oxygen decreased this am and he has started a short but rapid pattern of breathing.  I spoke with his wife and son.  Will plan to initiate a morphine drip but I suspect that he will be dying later today. He otherwise does not appear in distress. I have obtained the phone number for [facility] where he desires to have his body donated.  Filed Vitals:   Jul 02, 2013 0555  BP: 111/56  Pulse: 103  Temp: 102.9 F (39.4 C)  Resp: 46   Physical exam:   General: unresponsive to tactile stimuli, short rapid ineffective breaths are present Pupils not examined, mm dry Chest very little air entry CVS: rapid but not as forceful as yesterday Abd: scaphoid and soft, not distended Neuro: unresponsive  Assessment and plan: 80 yr old white male with admitted with terminal agitation.  Lung mass suspicious for cancer.  Family elected comfort care.  He has declined overnight and appears to be actively dying at this time.  Respirations warrant morphine drip.  1.  DNR  2.  Respiratory distress:  Initiate morphine drip at 0.5 mg /hr with bolus and can titrate for comfort.   May discontinue oxygen at any time if family desires to do so.  3. Terminal agitation: can continue prn ativan, and valproate  4.  Aggressive mouth care for dry mouth.   Total time [id number]    Jacob Boyd L. Lovena Le, MD MBA The Palliative Medicine Team at [facility] Phone: [phone] Pager: [phone]

## 2013-07-03 NOTE — Significant Event (Signed)
Called to room by family, patient was with out pulse or respirations, death confirmed by Reyne Dumas RN.

## 2013-07-03 NOTE — Progress Notes (Signed)
While rounding the chaplain was alerted that Mr Jacob Boyd had just died. The visit with the family was brief. Ms Jacob Boyd stated she and her son was leaving to be with family and there was not further assistance the family needed.  No spiritual care follow up anticipated.  Sallee Lange. Jeanne Diefendorf, DMin, MDiv, MA Chaplain

## 2013-07-03 DEATH — deceased

## 2013-09-14 IMAGING — CR DG CHEST 2V
2 series · 2 of 2 positions shown · non-contrast
Comparison: 06/16/2012.

CLINICAL DATA: Followup nodules.

EXAM:
CHEST  2 VIEW

[view not recorded (1 of 2)]
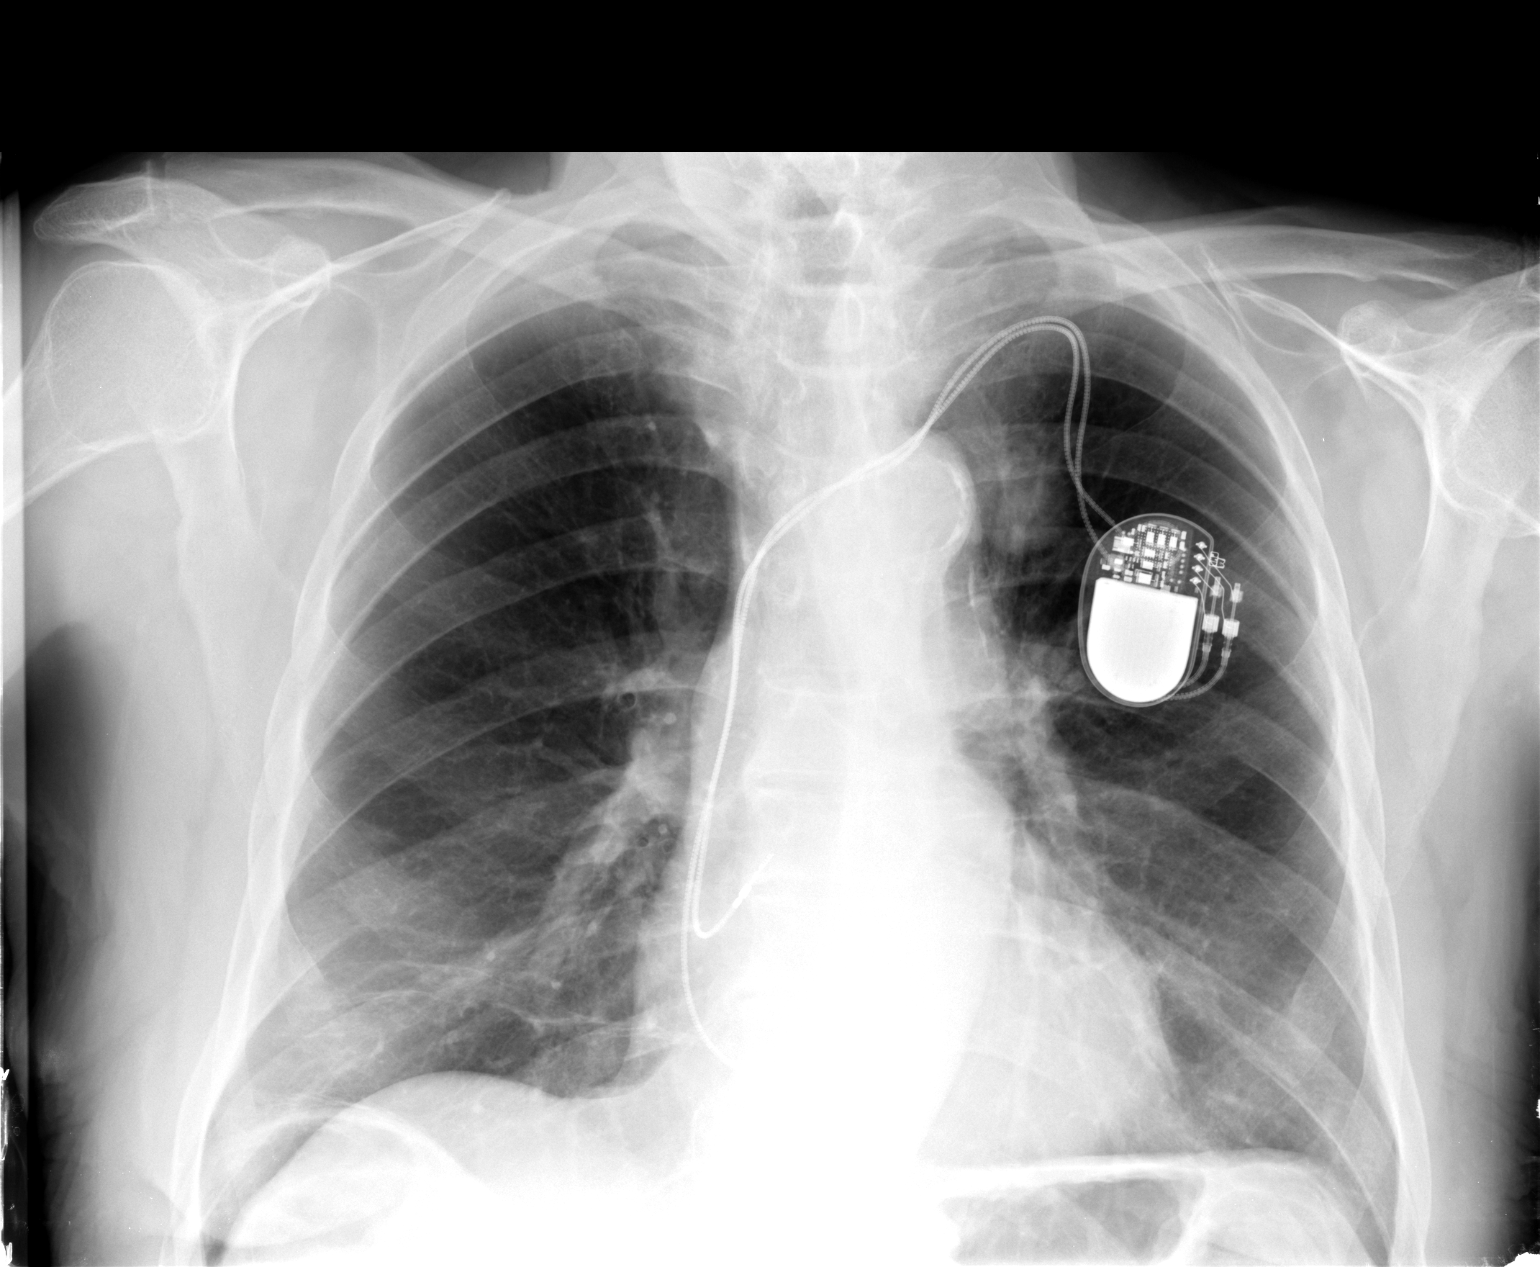

[view not recorded (2 of 2)]
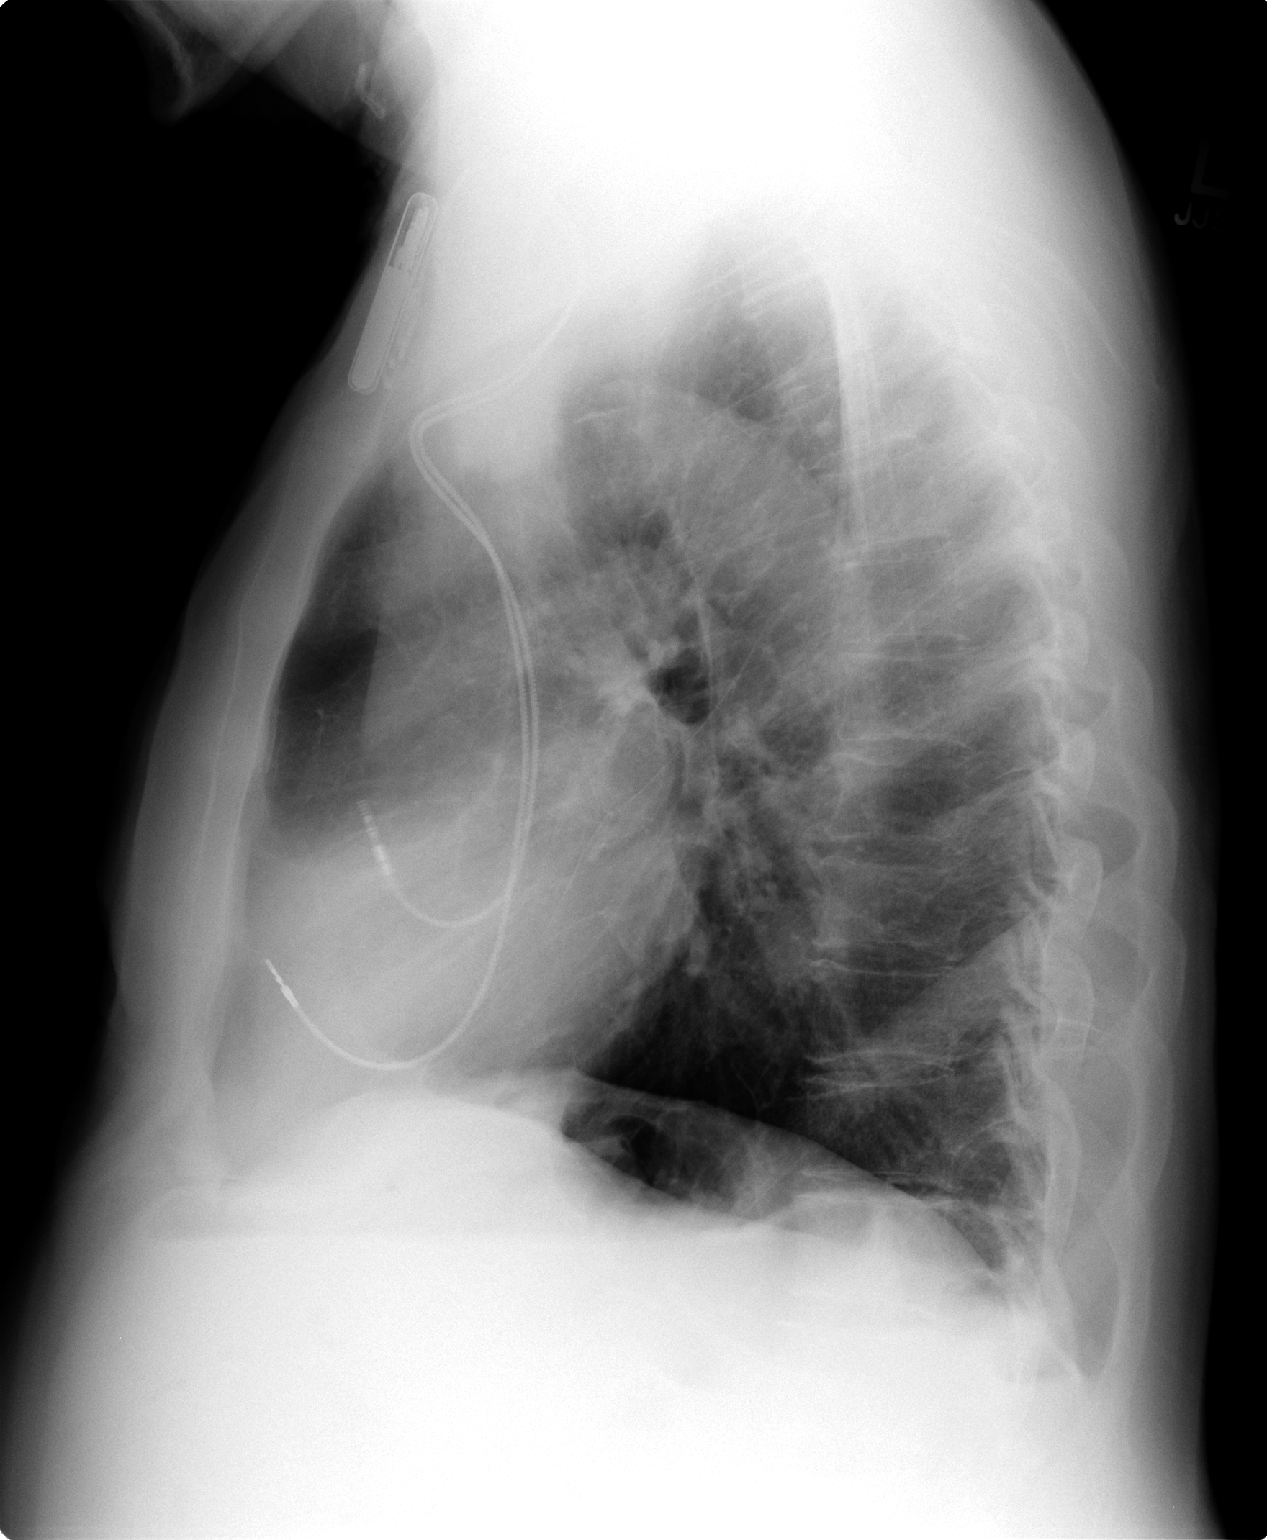

[2 of 2 positions shown; findings below may reference images not displayed]

FINDINGS: Left pacer remains in place, unchanged. Heart is normal size. Linear
scarring in the lung bases, right greater than left. Previously seen
small nodule in the right midlung remains evident and unchanged.
There is a density adjacent to the aortic arch in the left upper
lobe, better seen on today's study but unchanged. This appears more
apparent than on prior study from 08/11/2011. I would recommend
further evaluation with chest CT.
IMPRESSION: Airspace density in the left upper lobe adjacent to the aortic arch.
Recommend further evaluation with chest CT followup preferably with
IV contrast.

Stable small nodular density in the right midlung.

Stable scarring in the lung bases.

## 2014-06-07 IMAGING — CT CT CHEST W/O CM
3 of 4 series · 14 of 30 positions shown, 17 images · non-contrast
Comparison: 05/31/2013, 09/08/2012 and 08/11/2011 chest x-ray. No
comparison chest CT.

CLINICAL DATA: Abnormal chest x-ray.  Prostate cancer.

EXAM:
CT CHEST WITHOUT CONTRAST
TECHNIQUE: Multidetector CT imaging of the chest was performed following the
standard protocol without IV contrast.

[Series 2: rtn chest without st · axial · non-contrast · 0.68mm/px · z∈[-269,-49]mm · 10 of 56 slices shown, 13 images (1 of 2)]
[im 6/56  mediastinal]
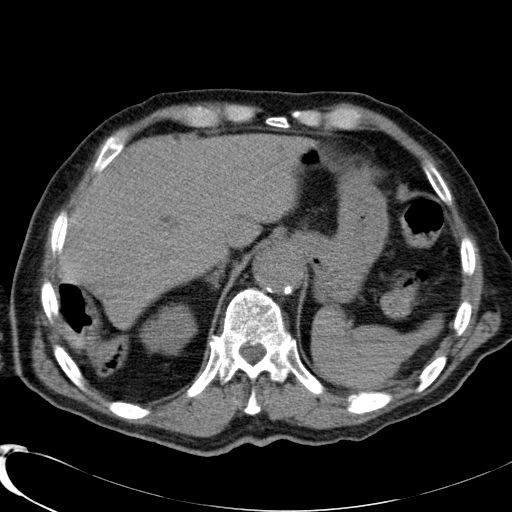
[im 6/56  lung]
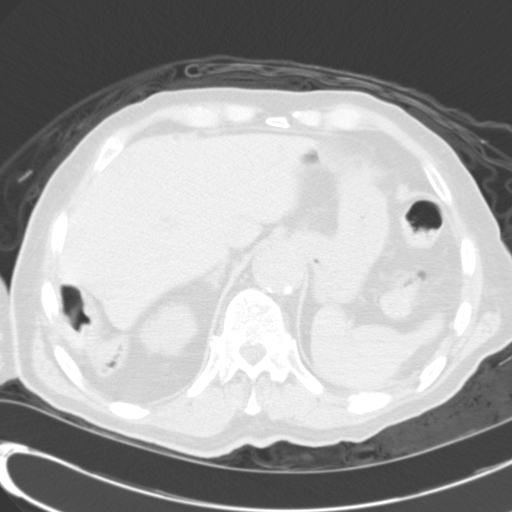
[im 12/56  lung]
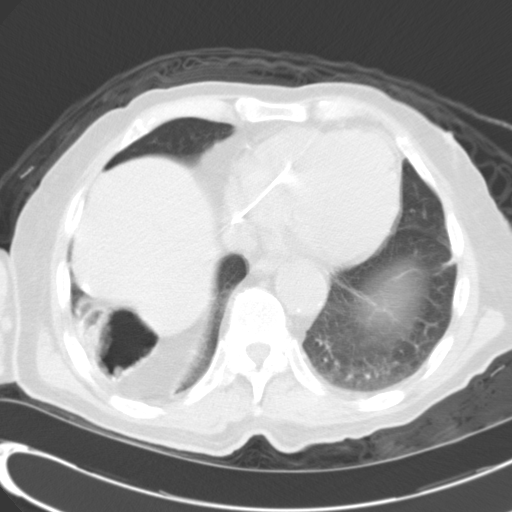
[im 17/56  lung]
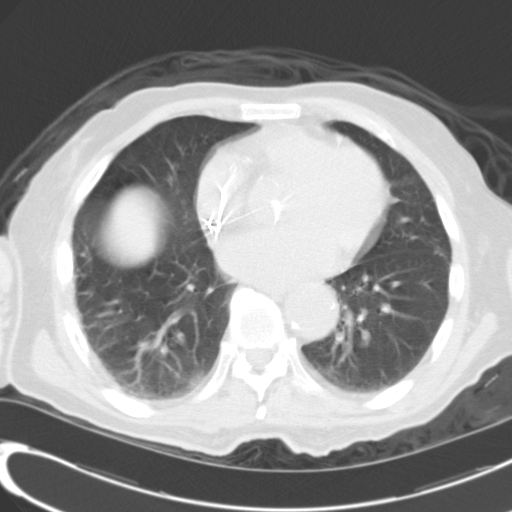
[im 23/56  lung]
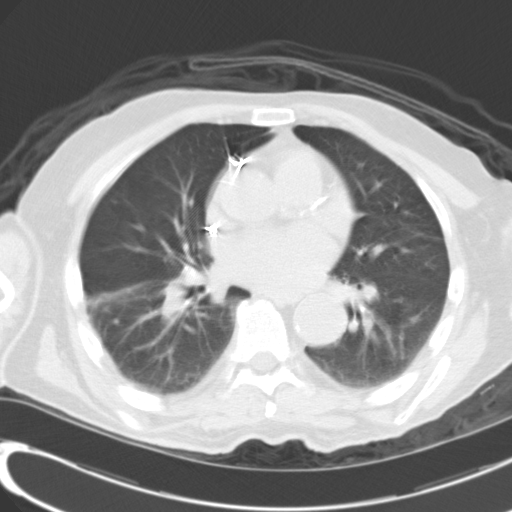
[im 27/56  mediastinal]
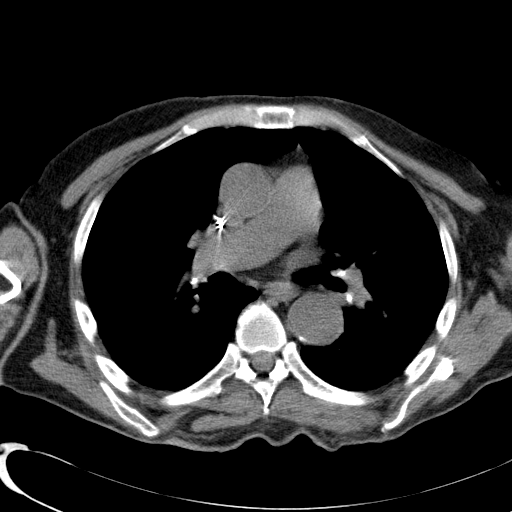
[im 27/56  lung]
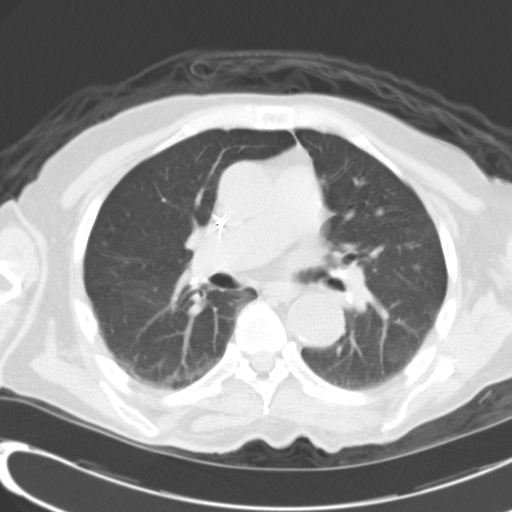
[im 28/56  lung]
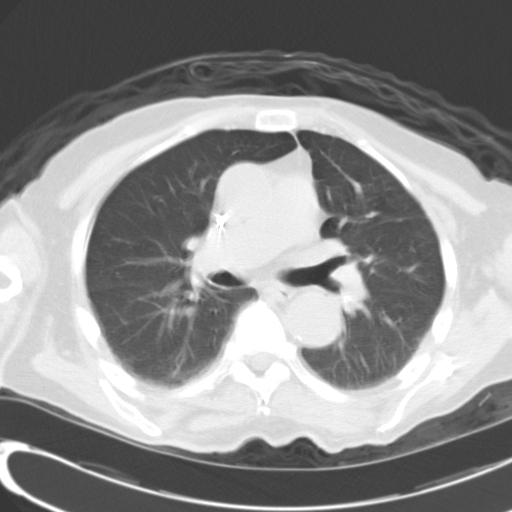
[im 34/56  lung]
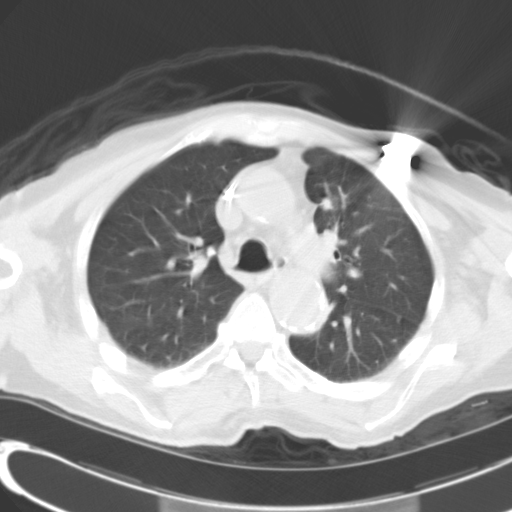
[im 39/56  lung]
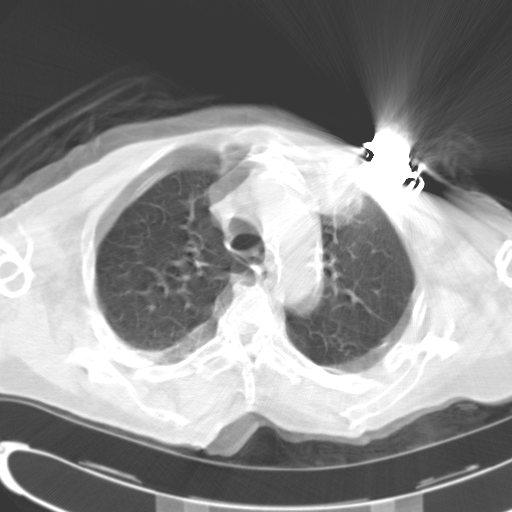
[im 45/56  mediastinal]
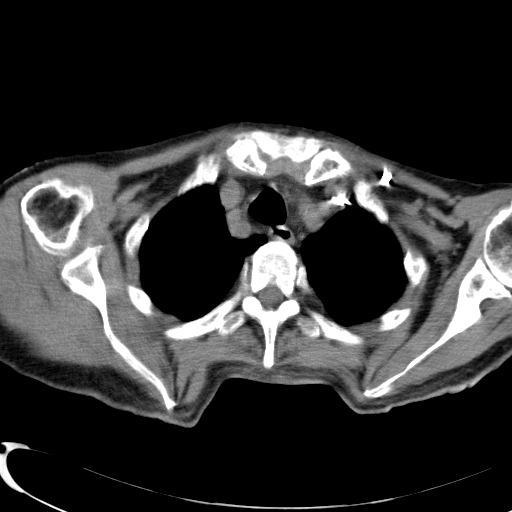
[im 45/56  lung]
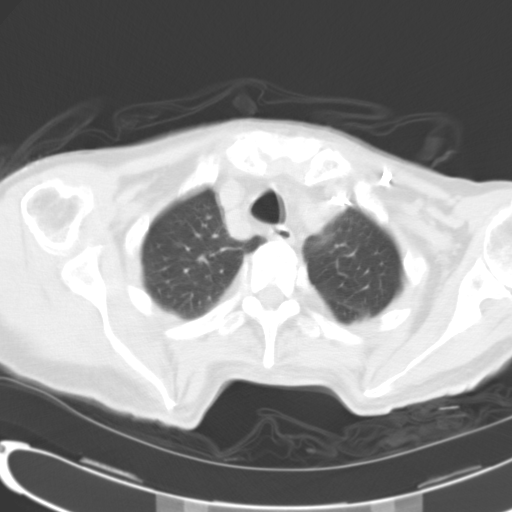
[im 50/56  lung]
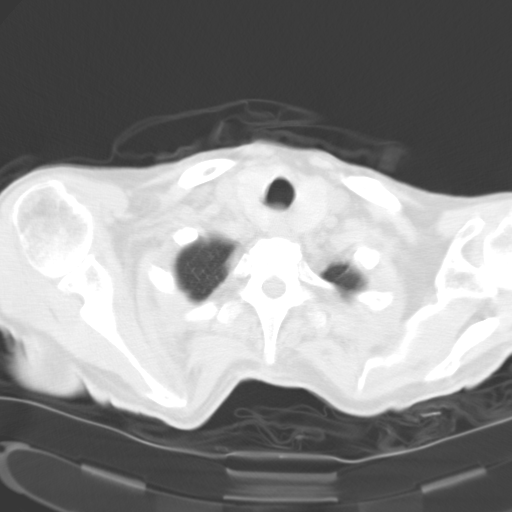

[Series 4: rtn chest without st · axial · non-contrast · 0.68mm/px · z∈[-104,-64]mm · 2 of 26 slices shown (2 of 2)]
[im 9/26  lung]
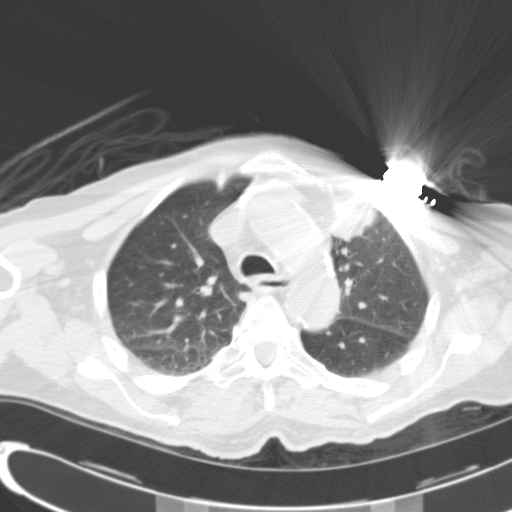
[im 17/26  lung]
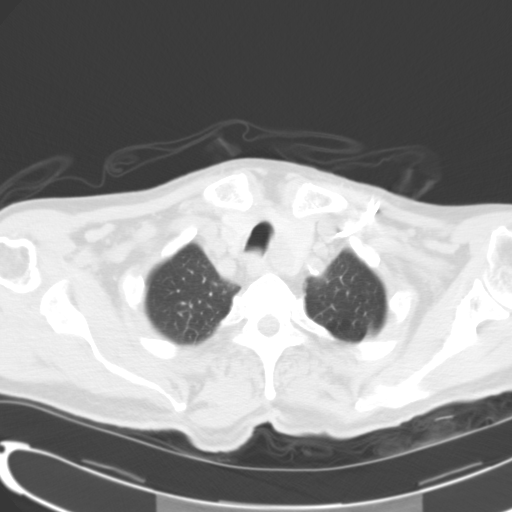

[Series 9: lung windows · axial · 0.68mm/px · z∈[-104,-64]mm · 2 of 26 slices shown]
[im 9/26  lung]
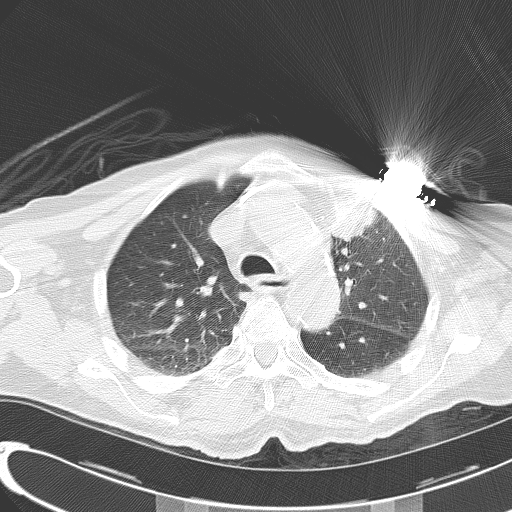
[im 17/26  lung]
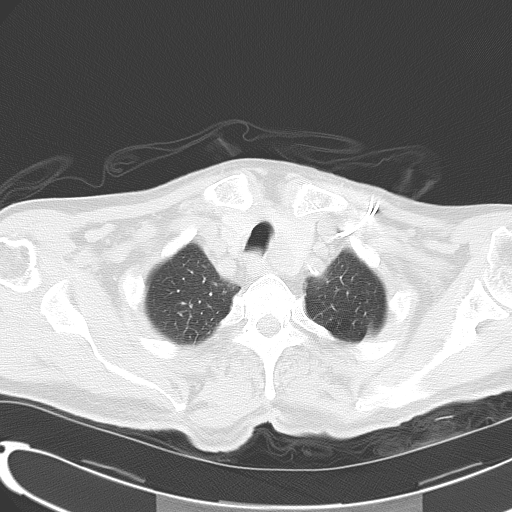

[14 of 30 positions shown; findings below may reference images not displayed]

FINDINGS: Patient moved.  Images repeated.

Anterior superior left upper lobe 3.7 x 3.2 x 3.8 cm mass abuts the
anterior pleural surface (cannot exclude invasion) and mediastinal
fat. Extending from the main mass are irregular smaller satellite
lesions with mild tenting of adjacent lung parenchyma. Findings
highly suspicious for malignancy. No adjacent adenopathy.

9 mm pleural-based nodule right lower lobe (series 7, image 32).
Question 3 mm nodule right lower lobe (series 7, image 34).

Granuloma right upper lobe and left lung base.

3 cm left lobe of thyroid lesion with extension into the upper
mediastinum.

Sequential pacemaker is in place. Battery pack causes streak
artifact at the level of the left upper lobe mass.

Heart size top-normal.  Coronary artery calcifications.

Atherosclerotic type changes thoracic aorta most notable involving
the descending thoracic aorta are.

Only portion of the right adrenal gland is visualized and
unremarkable. Left adrenal gland not visualized. No obvious liver
lesion. Entire liver not imaged.

Degenerative changes thoracic spine without obvious bony destructive
lesion. Evaluation limited by the degree of motion.
IMPRESSION: Patient moved.  Images repeated.

Anterior superior left upper lobe 3.7 x 3.2 x 3.8 cm mass abuts the
anterior pleural surface (cannot exclude invasion) and mediastinal
fat. Extending from the main mass are irregular smaller satellite
lesions with mild tenting of adjacent lung parenchyma. Findings
highly suspicious for malignancy. No adjacent adenopathy.

9 mm pleural-based nodule right lower lobe (series 7, image 32).
Question 3 mm nodule right lower lobe (series 7, image 34).

3 cm left lobe of thyroid lesion with extension into the upper
mediastinum.

Sequential pacemaker is in place. Battery pack causes streak
artifact at the level of the left upper lobe mass.

## 2014-06-07 IMAGING — CT CT HEAD W/O CM
1 series · 16 of 30 positions shown, 20 images · non-contrast
Comparison: 05/04/2013.

CLINICAL DATA: Altered mental status. Agitated. Parkinson's
disease.

EXAM:
CT HEAD WITHOUT CONTRAST
TECHNIQUE: Contiguous axial images were obtained from the base of the skull
through the vertex without intravenous contrast.

[Series 2: head_seq 4.5 h37s st · axial · 0.43mm/px · z∈[-96,+48]mm · 16 of 36 slices shown, 20 images]
[im 2/36  brain]
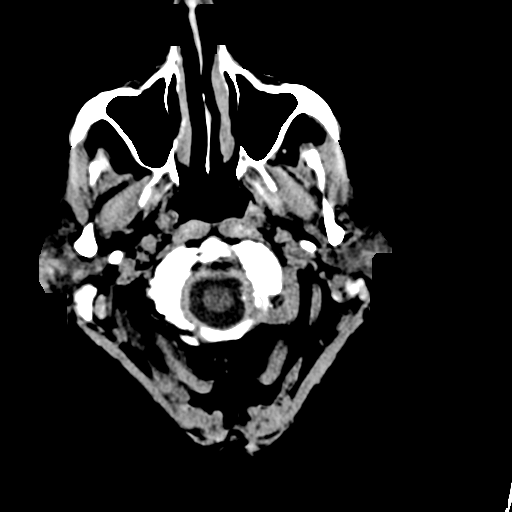
[im 2/36  bone]
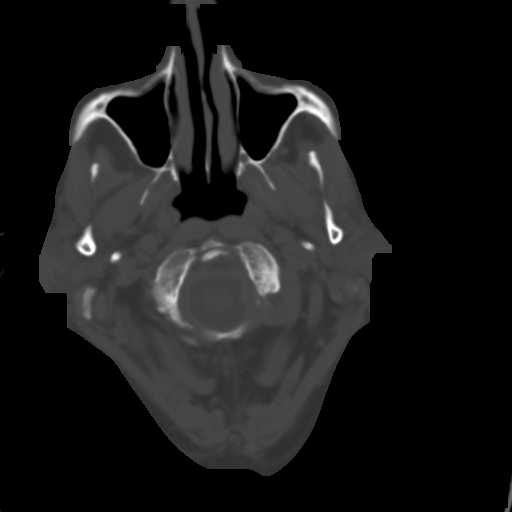
[im 4/36  brain]
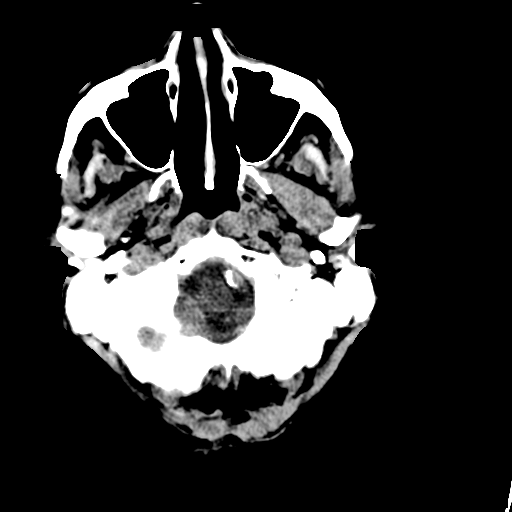
[im 7/36  brain]
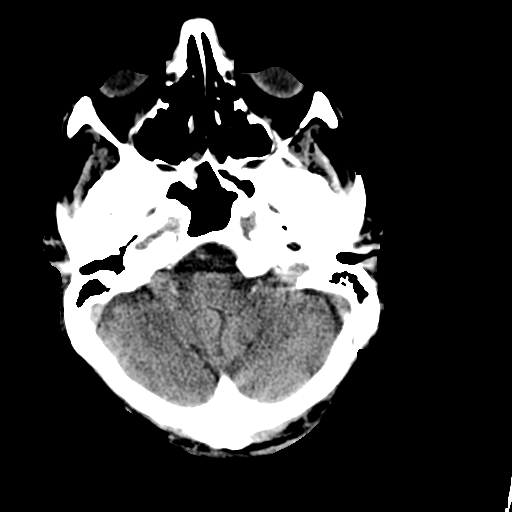
[im 9/36  brain]
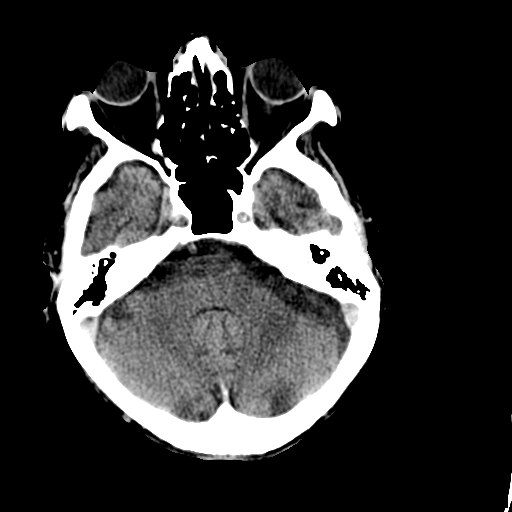
[im 10/36  brain]
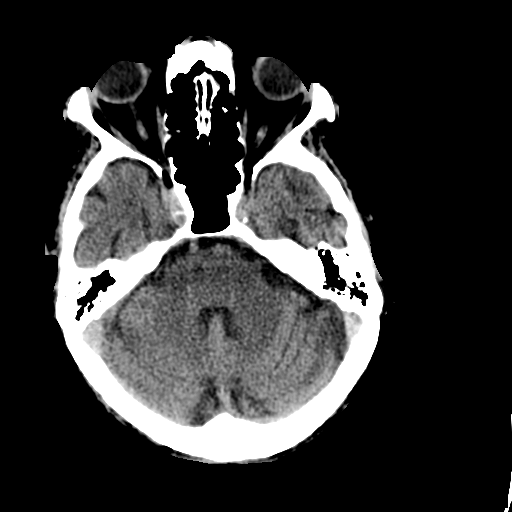
[im 10/36  bone]
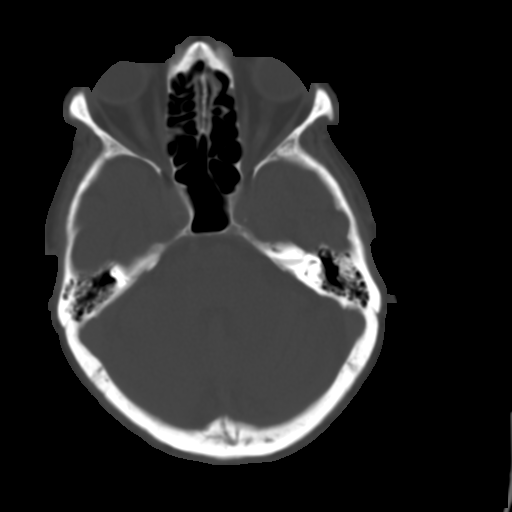
[im 13/36  brain]
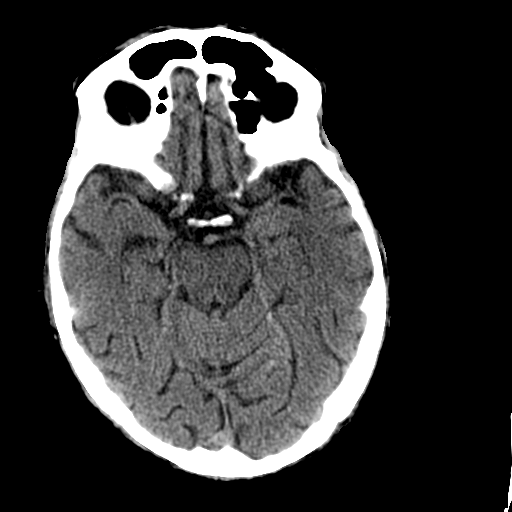
[im 15/36  brain]
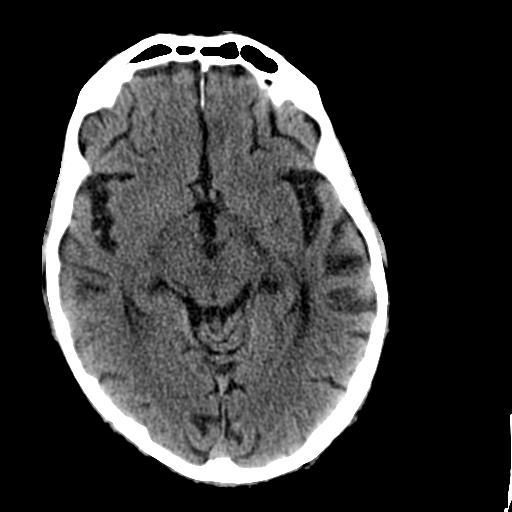
[im 17/36  brain]
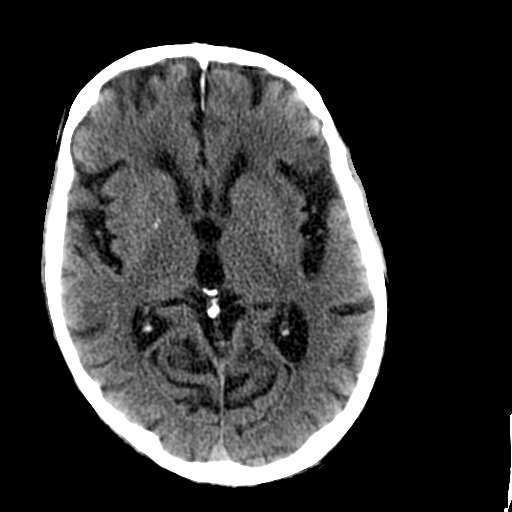
[im 19/36  brain]
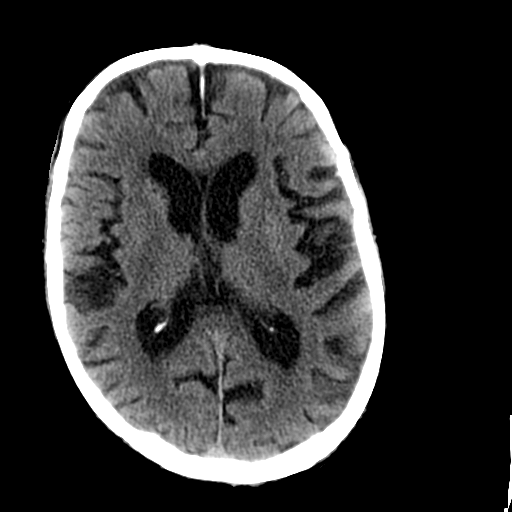
[im 19/36  bone]
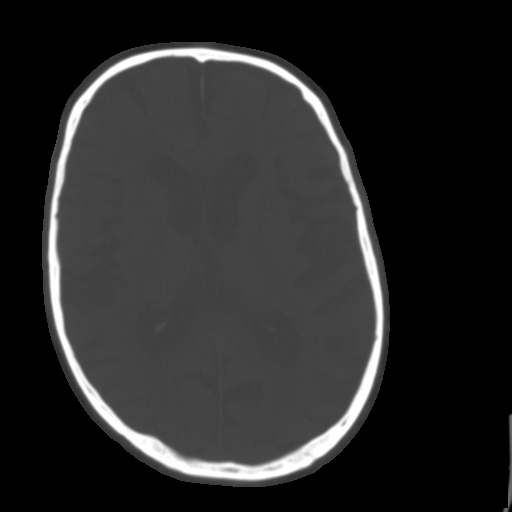
[im 21/36  brain]
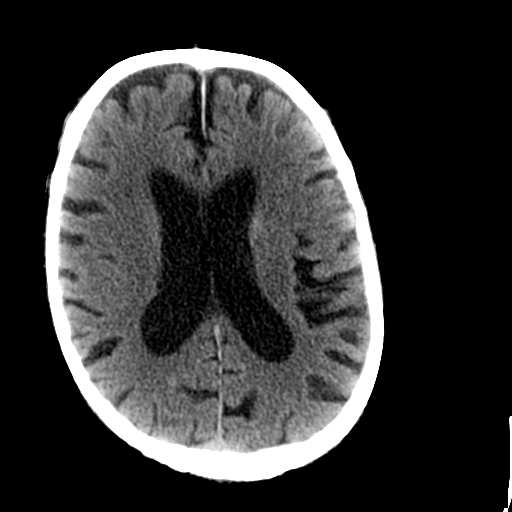
[im 23/36  brain]
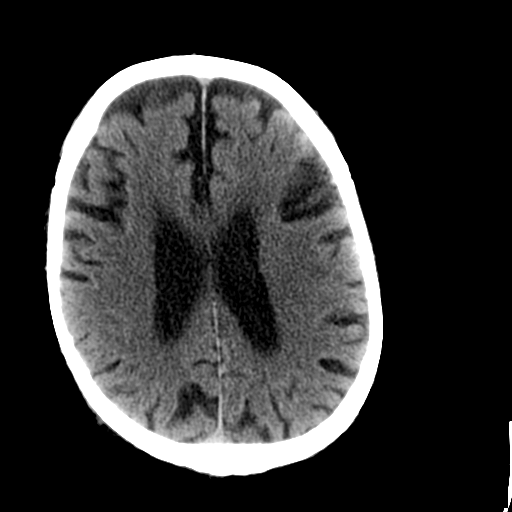
[im 26/36  brain]
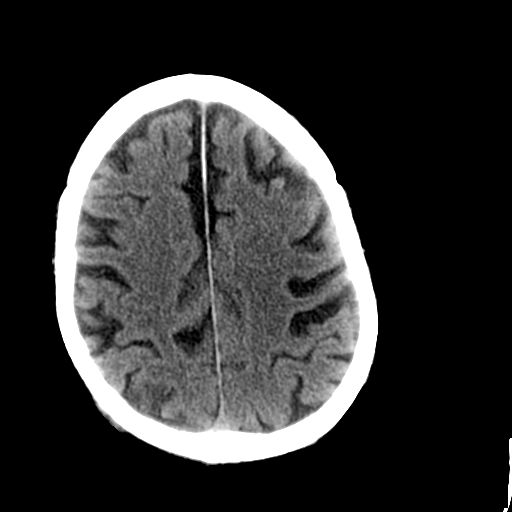
[im 27/36  brain]
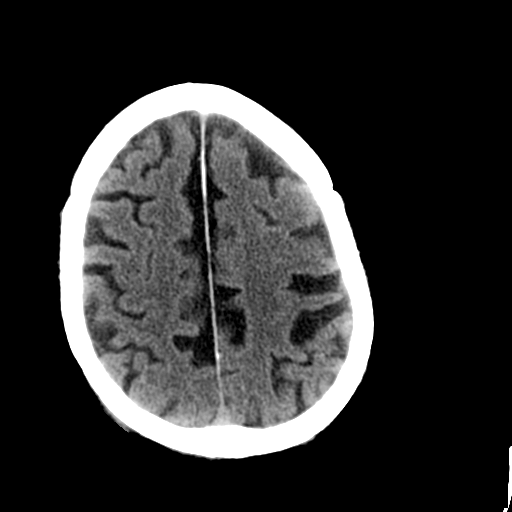
[im 27/36  bone]
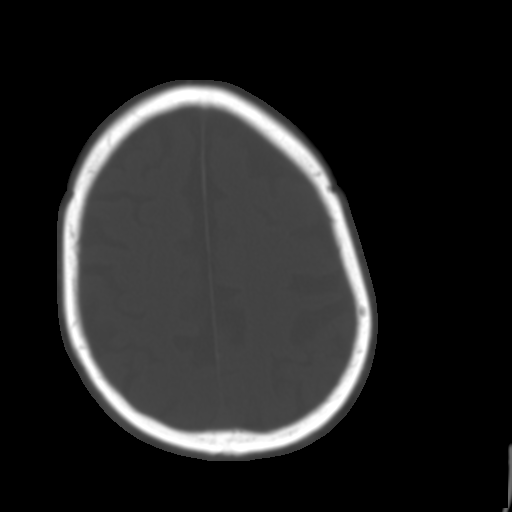
[im 29/36  brain]
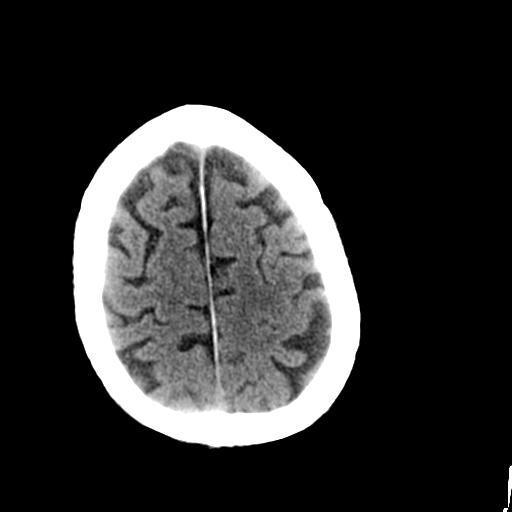
[im 32/36  brain]
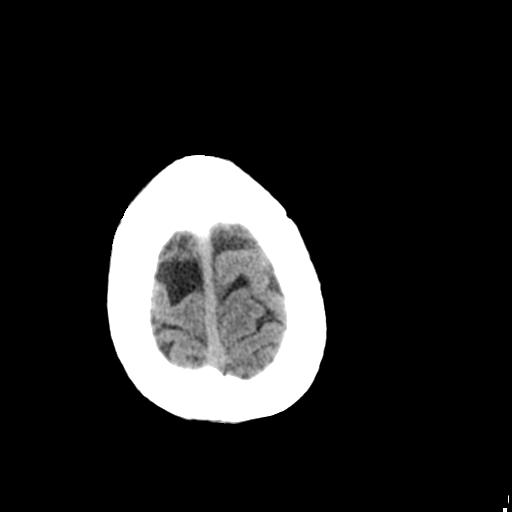
[im 34/36  brain]
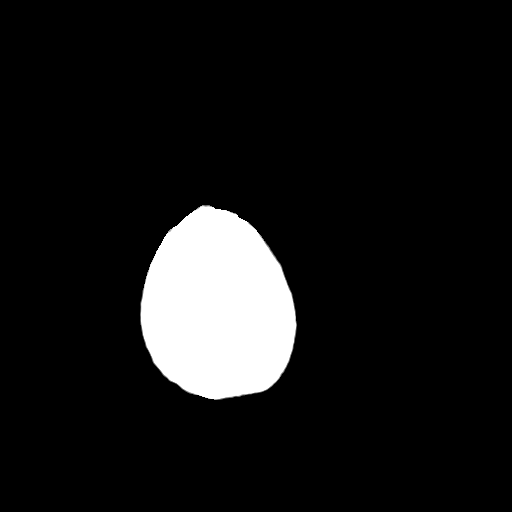

[16 of 30 positions shown; findings below may reference images not displayed]

FINDINGS: No intracranial hemorrhage.

Small vessel disease type changes without CT evidence of large acute
infarct.

No intracranial mass lesion noted on this unenhanced exam.

Vascular calcifications.

Global atrophy without hydrocephalus.
IMPRESSION: No acute abnormality.  Please see above.

## 2015-03-20 ENCOUNTER — Other Ambulatory Visit (HOSPITAL_COMMUNITY): Payer: Medicare Other

## 2015-03-20 ENCOUNTER — Ambulatory Visit: Payer: Medicare Other | Admitting: Family
